# Patient Record
Sex: Female | Born: 1945
Health system: Southern US, Community
[De-identification: ages and names within clinical notes are randomized; demographics above are authoritative.]

## PROBLEM LIST (undated history)

## (undated) DIAGNOSIS — C439 Malignant melanoma of skin, unspecified: Secondary | ICD-10-CM

## (undated) DIAGNOSIS — I712 Thoracic aortic aneurysm, without rupture, unspecified: Secondary | ICD-10-CM

## (undated) DIAGNOSIS — Z923 Personal history of irradiation: Secondary | ICD-10-CM

## (undated) DIAGNOSIS — R000 Tachycardia, unspecified: Secondary | ICD-10-CM

## (undated) DIAGNOSIS — M1990 Unspecified osteoarthritis, unspecified site: Secondary | ICD-10-CM

## (undated) DIAGNOSIS — K219 Gastro-esophageal reflux disease without esophagitis: Secondary | ICD-10-CM

## (undated) DIAGNOSIS — C50919 Malignant neoplasm of unspecified site of unspecified female breast: Secondary | ICD-10-CM

## (undated) DIAGNOSIS — R079 Chest pain, unspecified: Secondary | ICD-10-CM

## (undated) DIAGNOSIS — J302 Other seasonal allergic rhinitis: Secondary | ICD-10-CM

## (undated) HISTORY — DX: Malignant melanoma of skin, unspecified: C43.9

## (undated) HISTORY — DX: Malignant neoplasm of unspecified site of unspecified female breast: C50.919

## (undated) HISTORY — DX: Gastro-esophageal reflux disease without esophagitis: K21.9

## (undated) HISTORY — DX: Other seasonal allergic rhinitis: J30.2

## (undated) HISTORY — DX: Thoracic aortic aneurysm, without rupture: I71.2

## (undated) HISTORY — PX: INGUINAL HERNIA REPAIR: SUR1180

## (undated) HISTORY — DX: Thoracic aortic aneurysm, without rupture, unspecified: I71.20

## (undated) HISTORY — DX: Unspecified osteoarthritis, unspecified site: M19.90

## (undated) HISTORY — DX: Tachycardia, unspecified: R00.0

## (undated) HISTORY — PX: TUBAL LIGATION: SHX77

## (undated) HISTORY — DX: Chest pain, unspecified: R07.9

## (undated) HISTORY — PX: OTHER SURGICAL HISTORY: SHX169

---

## 1983-11-19 HISTORY — PX: INGUINAL HERNIA REPAIR: SUR1180

## 1983-11-19 HISTORY — PX: TUBAL LIGATION: SHX77

## 2000-02-06 ENCOUNTER — Other Ambulatory Visit: Admission: RE | Admit: 2000-02-06 | Discharge: 2000-02-06 | Payer: Self-pay | Admitting: Obstetrics & Gynecology

## 2001-02-11 ENCOUNTER — Other Ambulatory Visit: Admission: RE | Admit: 2001-02-11 | Discharge: 2001-02-11 | Payer: Self-pay | Admitting: Obstetrics & Gynecology

## 2001-03-18 ENCOUNTER — Ambulatory Visit (HOSPITAL_COMMUNITY): Admission: RE | Admit: 2001-03-18 | Discharge: 2001-03-18 | Payer: Self-pay | Admitting: Gastroenterology

## 2001-03-18 ENCOUNTER — Encounter (INDEPENDENT_AMBULATORY_CARE_PROVIDER_SITE_OTHER): Payer: Self-pay | Admitting: Specialist

## 2002-02-25 ENCOUNTER — Other Ambulatory Visit: Admission: RE | Admit: 2002-02-25 | Discharge: 2002-02-25 | Payer: Self-pay | Admitting: Obstetrics & Gynecology

## 2003-03-02 ENCOUNTER — Other Ambulatory Visit: Admission: RE | Admit: 2003-03-02 | Discharge: 2003-03-02 | Payer: Self-pay | Admitting: Obstetrics & Gynecology

## 2004-02-22 ENCOUNTER — Ambulatory Visit (HOSPITAL_COMMUNITY): Admission: RE | Admit: 2004-02-22 | Discharge: 2004-02-22 | Payer: Self-pay | Admitting: Family Medicine

## 2004-03-02 ENCOUNTER — Other Ambulatory Visit: Admission: RE | Admit: 2004-03-02 | Discharge: 2004-03-02 | Payer: Self-pay | Admitting: Obstetrics & Gynecology

## 2004-09-07 ENCOUNTER — Encounter: Payer: Self-pay | Admitting: Internal Medicine

## 2005-04-02 ENCOUNTER — Other Ambulatory Visit: Admission: RE | Admit: 2005-04-02 | Discharge: 2005-04-02 | Payer: Self-pay | Admitting: Obstetrics & Gynecology

## 2005-11-18 DIAGNOSIS — Z923 Personal history of irradiation: Secondary | ICD-10-CM | POA: Insufficient documentation

## 2005-11-18 HISTORY — PX: BREAST BIOPSY: SHX20

## 2005-11-18 HISTORY — PX: BREAST LUMPECTOMY: SHX2

## 2005-11-18 HISTORY — DX: Personal history of irradiation: Z92.3

## 2006-05-06 ENCOUNTER — Encounter (INDEPENDENT_AMBULATORY_CARE_PROVIDER_SITE_OTHER): Payer: Self-pay | Admitting: *Deleted

## 2006-05-06 ENCOUNTER — Encounter (INDEPENDENT_AMBULATORY_CARE_PROVIDER_SITE_OTHER): Payer: Self-pay | Admitting: Radiology

## 2006-05-06 ENCOUNTER — Encounter: Admission: RE | Admit: 2006-05-06 | Discharge: 2006-05-06 | Payer: Self-pay | Admitting: Obstetrics & Gynecology

## 2006-05-16 ENCOUNTER — Encounter: Admission: RE | Admit: 2006-05-16 | Discharge: 2006-05-16 | Payer: Self-pay | Admitting: Obstetrics & Gynecology

## 2006-05-16 ENCOUNTER — Encounter: Admission: RE | Admit: 2006-05-16 | Discharge: 2006-05-16 | Payer: Self-pay | Admitting: General Surgery

## 2006-05-19 ENCOUNTER — Ambulatory Visit (HOSPITAL_BASED_OUTPATIENT_CLINIC_OR_DEPARTMENT_OTHER): Admission: RE | Admit: 2006-05-19 | Discharge: 2006-05-19 | Payer: Self-pay | Admitting: General Surgery

## 2006-05-19 ENCOUNTER — Encounter (INDEPENDENT_AMBULATORY_CARE_PROVIDER_SITE_OTHER): Payer: Self-pay | Admitting: Specialist

## 2006-05-19 ENCOUNTER — Encounter: Admission: RE | Admit: 2006-05-19 | Discharge: 2006-05-19 | Payer: Self-pay | Admitting: General Surgery

## 2006-05-26 ENCOUNTER — Ambulatory Visit: Payer: Self-pay | Admitting: Oncology

## 2006-06-11 LAB — CBC WITH DIFFERENTIAL/PLATELET
Basophils Absolute: 0 10*3/uL (ref 0.0–0.1)
EOS%: 3.4 % (ref 0.0–7.0)
Eosinophils Absolute: 0.2 10*3/uL (ref 0.0–0.5)
HGB: 12.9 g/dL (ref 11.6–15.9)
LYMPH%: 33.5 % (ref 14.0–48.0)
MCH: 30 pg (ref 26.0–34.0)
MCV: 89.7 fL (ref 81.0–101.0)
MONO%: 4.2 % (ref 0.0–13.0)
NEUT#: 3.2 10*3/uL (ref 1.5–6.5)
Platelets: 322 10*3/uL (ref 145–400)
RBC: 4.3 10*6/uL (ref 3.70–5.32)
RDW: 14.2 % (ref 11.3–14.5)

## 2006-06-11 LAB — COMPREHENSIVE METABOLIC PANEL
AST: 17 U/L (ref 0–37)
Alkaline Phosphatase: 60 U/L (ref 39–117)
BUN: 14 mg/dL (ref 6–23)
Glucose, Bld: 121 mg/dL — ABNORMAL HIGH (ref 70–99)
Potassium: 4.2 mEq/L (ref 3.5–5.3)
Total Bilirubin: 0.4 mg/dL (ref 0.3–1.2)

## 2006-06-11 LAB — LACTATE DEHYDROGENASE: LDH: 143 U/L (ref 94–250)

## 2006-06-16 ENCOUNTER — Ambulatory Visit: Admission: RE | Admit: 2006-06-16 | Discharge: 2006-09-09 | Payer: Self-pay | Admitting: Radiation Oncology

## 2006-08-08 ENCOUNTER — Ambulatory Visit: Payer: Self-pay | Admitting: Oncology

## 2006-10-03 ENCOUNTER — Ambulatory Visit: Payer: Self-pay | Admitting: Oncology

## 2006-10-07 LAB — URINALYSIS, MICROSCOPIC - CHCC
Nitrite: NEGATIVE
Protein: NEGATIVE mg/dL
pH: 7.5 (ref 4.6–8.0)

## 2007-01-29 ENCOUNTER — Ambulatory Visit: Payer: Self-pay | Admitting: Oncology

## 2007-02-03 LAB — CBC WITH DIFFERENTIAL/PLATELET
Basophils Absolute: 0 10*3/uL (ref 0.0–0.1)
EOS%: 4.1 % (ref 0.0–7.0)
HCT: 38.1 % (ref 34.8–46.6)
HGB: 13.1 g/dL (ref 11.6–15.9)
MCH: 30 pg (ref 26.0–34.0)
MCV: 87.8 fL (ref 81.0–101.0)
MONO%: 5.6 % (ref 0.0–13.0)
NEUT%: 62.2 % (ref 39.6–76.8)

## 2007-02-03 LAB — COMPREHENSIVE METABOLIC PANEL
AST: 14 U/L (ref 0–37)
Alkaline Phosphatase: 67 U/L (ref 39–117)
BUN: 19 mg/dL (ref 6–23)
Calcium: 9.3 mg/dL (ref 8.4–10.5)
Chloride: 104 mEq/L (ref 96–112)
Creatinine, Ser: 0.78 mg/dL (ref 0.40–1.20)

## 2007-03-17 ENCOUNTER — Ambulatory Visit: Payer: Self-pay | Admitting: Oncology

## 2007-04-20 ENCOUNTER — Encounter: Admission: RE | Admit: 2007-04-20 | Discharge: 2007-04-20 | Payer: Self-pay | Admitting: Obstetrics & Gynecology

## 2007-05-05 ENCOUNTER — Ambulatory Visit: Payer: Self-pay | Admitting: Oncology

## 2007-05-05 ENCOUNTER — Encounter: Payer: Self-pay | Admitting: Internal Medicine

## 2007-05-05 LAB — CBC WITH DIFFERENTIAL/PLATELET
BASO%: 0.5 % (ref 0.0–2.0)
EOS%: 2.1 % (ref 0.0–7.0)
HCT: 36.6 % (ref 34.8–46.6)
HGB: 12.8 g/dL (ref 11.6–15.9)
LYMPH%: 27 % (ref 14.0–48.0)
MCH: 30.9 pg (ref 26.0–34.0)
MCHC: 35 g/dL (ref 32.0–36.0)
NEUT#: 3.1 10*3/uL (ref 1.5–6.5)
NEUT%: 64.7 % (ref 39.6–76.8)
Platelets: 285 10*3/uL (ref 145–400)
RBC: 4.15 10*6/uL (ref 3.70–5.32)
WBC: 4.9 10*3/uL (ref 3.9–10.0)
lymph#: 1.3 10*3/uL (ref 0.9–3.3)

## 2007-05-05 LAB — CANCER ANTIGEN 27.29: CA 27.29: 13 U/mL (ref 0–39)

## 2007-05-05 LAB — COMPREHENSIVE METABOLIC PANEL
ALT: 21 U/L (ref 0–35)
AST: 18 U/L (ref 0–37)
Alkaline Phosphatase: 60 U/L (ref 39–117)
Creatinine, Ser: 0.77 mg/dL (ref 0.40–1.20)
Sodium: 142 mEq/L (ref 135–145)
Total Bilirubin: 0.3 mg/dL (ref 0.3–1.2)

## 2007-05-07 ENCOUNTER — Ambulatory Visit (HOSPITAL_COMMUNITY): Admission: RE | Admit: 2007-05-07 | Discharge: 2007-05-07 | Payer: Self-pay | Admitting: Oncology

## 2007-05-11 LAB — ESTRADIOL, ULTRA SENS: Estradiol, Ultra Sensitive: <2 pg/mL

## 2007-11-10 ENCOUNTER — Encounter: Payer: Self-pay | Admitting: Internal Medicine

## 2008-03-25 ENCOUNTER — Ambulatory Visit: Payer: Self-pay | Admitting: Oncology

## 2008-03-29 LAB — COMPREHENSIVE METABOLIC PANEL
ALT: 18 U/L (ref 0–35)
Albumin: 4.3 g/dL (ref 3.5–5.2)
Alkaline Phosphatase: 59 U/L (ref 39–117)
CO2: 24 mEq/L (ref 19–32)
Potassium: 4.4 mEq/L (ref 3.5–5.3)
Sodium: 142 mEq/L (ref 135–145)
Total Bilirubin: 0.3 mg/dL (ref 0.3–1.2)
Total Protein: 6.9 g/dL (ref 6.0–8.3)

## 2008-03-29 LAB — CBC WITH DIFFERENTIAL/PLATELET
BASO%: 0 % (ref 0.0–2.0)
LYMPH%: 22.8 % (ref 14.0–48.0)
MCHC: 34.1 g/dL (ref 32.0–36.0)
MONO#: 0.2 10*3/uL (ref 0.1–0.9)
NEUT#: 3.6 10*3/uL (ref 1.5–6.5)
Platelets: 298 10*3/uL (ref 145–400)
RBC: 4.12 10*6/uL (ref 3.70–5.32)
RDW: 13.2 % (ref 11.3–14.5)
WBC: 5.1 10*3/uL (ref 3.9–10.0)

## 2008-03-29 LAB — CANCER ANTIGEN 27.29: CA 27.29: 12 U/mL (ref 0–39)

## 2008-04-04 LAB — ESTRADIOL, ULTRA SENS: Estradiol, Ultra Sensitive: <2 pg/mL

## 2008-04-21 ENCOUNTER — Encounter: Admission: RE | Admit: 2008-04-21 | Discharge: 2008-04-21 | Payer: Self-pay | Admitting: Oncology

## 2008-09-19 ENCOUNTER — Encounter: Payer: Self-pay | Admitting: Internal Medicine

## 2008-10-19 ENCOUNTER — Encounter: Payer: Self-pay | Admitting: Internal Medicine

## 2008-11-01 ENCOUNTER — Ambulatory Visit (HOSPITAL_COMMUNITY): Admission: RE | Admit: 2008-11-01 | Discharge: 2008-11-01 | Payer: Self-pay | Admitting: Family Medicine

## 2009-03-28 ENCOUNTER — Ambulatory Visit: Payer: Self-pay | Admitting: Oncology

## 2009-03-30 ENCOUNTER — Encounter: Payer: Self-pay | Admitting: Internal Medicine

## 2009-03-30 LAB — CBC WITH DIFFERENTIAL/PLATELET
BASO%: 0.5 % (ref 0.0–2.0)
EOS%: 3.5 % (ref 0.0–7.0)
HCT: 39.6 % (ref 34.8–46.6)
MCH: 30.1 pg (ref 25.1–34.0)
MCHC: 33.8 g/dL (ref 31.5–36.0)
MONO#: 0.2 10*3/uL (ref 0.1–0.9)
NEUT%: 53.4 % (ref 38.4–76.8)
RDW: 13.9 % (ref 11.2–14.5)
WBC: 4.4 10*3/uL (ref 3.9–10.3)
lymph#: 1.6 10*3/uL (ref 0.9–3.3)

## 2009-03-31 LAB — CANCER ANTIGEN 27.29: CA 27.29: 15 U/mL (ref 0–39)

## 2009-03-31 LAB — VITAMIN D 25 HYDROXY (VIT D DEFICIENCY, FRACTURES): Vit D, 25-Hydroxy: 38 ng/mL (ref 30–89)

## 2009-03-31 LAB — COMPREHENSIVE METABOLIC PANEL
ALT: 17 U/L (ref 0–35)
AST: 15 U/L (ref 0–37)
Albumin: 4.5 g/dL (ref 3.5–5.2)
CO2: 26 mEq/L (ref 19–32)
Calcium: 9.4 mg/dL (ref 8.4–10.5)
Chloride: 104 mEq/L (ref 96–112)
Potassium: 4.5 mEq/L (ref 3.5–5.3)
Sodium: 139 mEq/L (ref 135–145)
Total Protein: 7.2 g/dL (ref 6.0–8.3)

## 2009-04-06 ENCOUNTER — Encounter: Payer: Self-pay | Admitting: Internal Medicine

## 2009-04-10 ENCOUNTER — Ambulatory Visit (HOSPITAL_COMMUNITY): Admission: RE | Admit: 2009-04-10 | Discharge: 2009-04-10 | Payer: Self-pay | Admitting: Oncology

## 2009-04-24 ENCOUNTER — Encounter: Admission: RE | Admit: 2009-04-24 | Discharge: 2009-04-24 | Payer: Self-pay | Admitting: Obstetrics & Gynecology

## 2009-06-01 ENCOUNTER — Observation Stay (HOSPITAL_COMMUNITY): Admission: EM | Admit: 2009-06-01 | Discharge: 2009-06-02 | Payer: Self-pay | Admitting: Emergency Medicine

## 2009-06-05 ENCOUNTER — Encounter: Payer: Self-pay | Admitting: Internal Medicine

## 2009-06-21 ENCOUNTER — Encounter: Payer: Self-pay | Admitting: Internal Medicine

## 2009-07-04 ENCOUNTER — Encounter: Payer: Self-pay | Admitting: Internal Medicine

## 2009-09-20 ENCOUNTER — Encounter: Payer: Self-pay | Admitting: Internal Medicine

## 2009-09-28 ENCOUNTER — Ambulatory Visit: Payer: Self-pay | Admitting: Oncology

## 2009-10-19 ENCOUNTER — Encounter: Payer: Self-pay | Admitting: Internal Medicine

## 2009-11-07 DIAGNOSIS — R0989 Other specified symptoms and signs involving the circulatory and respiratory systems: Secondary | ICD-10-CM | POA: Insufficient documentation

## 2009-11-07 DIAGNOSIS — R079 Chest pain, unspecified: Secondary | ICD-10-CM

## 2009-11-09 ENCOUNTER — Ambulatory Visit: Payer: Self-pay | Admitting: Internal Medicine

## 2009-11-09 DIAGNOSIS — R9431 Abnormal electrocardiogram [ECG] [EKG]: Secondary | ICD-10-CM

## 2009-11-09 DIAGNOSIS — R002 Palpitations: Secondary | ICD-10-CM | POA: Insufficient documentation

## 2009-11-09 HISTORY — DX: Palpitations: R00.2

## 2009-11-09 HISTORY — DX: Abnormal electrocardiogram (ECG) (EKG): R94.31

## 2009-11-16 ENCOUNTER — Ambulatory Visit: Payer: Self-pay | Admitting: Oncology

## 2009-11-16 ENCOUNTER — Ambulatory Visit: Payer: Self-pay | Admitting: Cardiovascular Disease

## 2009-11-16 ENCOUNTER — Encounter: Payer: Self-pay | Admitting: Cardiology

## 2009-11-16 ENCOUNTER — Ambulatory Visit: Payer: Self-pay

## 2009-11-16 ENCOUNTER — Ambulatory Visit: Payer: Self-pay | Admitting: Internal Medicine

## 2009-11-16 ENCOUNTER — Ambulatory Visit (HOSPITAL_COMMUNITY): Admission: RE | Admit: 2009-11-16 | Discharge: 2009-11-16 | Payer: Self-pay | Admitting: Internal Medicine

## 2009-12-20 ENCOUNTER — Telehealth: Payer: Self-pay | Admitting: Internal Medicine

## 2010-01-08 ENCOUNTER — Ambulatory Visit: Payer: Self-pay | Admitting: Internal Medicine

## 2010-01-08 DIAGNOSIS — I712 Thoracic aortic aneurysm, without rupture, unspecified: Secondary | ICD-10-CM

## 2010-01-08 HISTORY — DX: Thoracic aortic aneurysm, without rupture, unspecified: I71.20

## 2010-01-08 HISTORY — DX: Thoracic aortic aneurysm, without rupture: I71.2

## 2010-01-12 ENCOUNTER — Encounter: Payer: Self-pay | Admitting: Internal Medicine

## 2010-01-12 LAB — CONVERTED CEMR LAB
ALT: 36 units/L — ABNORMAL HIGH (ref 0–35)
Albumin: 4.2 g/dL (ref 3.5–5.2)
BUN: 15 mg/dL (ref 6–23)
Calcium: 9.7 mg/dL (ref 8.4–10.5)
Free T4: 0.9 ng/dL (ref 0.6–1.6)
Potassium: 4.3 meq/L (ref 3.5–5.1)
Sodium: 144 meq/L (ref 135–145)
T3, Free: 2.8 pg/mL (ref 2.3–4.2)
TSH: 2.75 microintl units/mL (ref 0.35–5.50)
Total Protein: 7.4 g/dL (ref 6.0–8.3)

## 2010-02-13 ENCOUNTER — Ambulatory Visit: Payer: Self-pay | Admitting: Internal Medicine

## 2010-02-22 ENCOUNTER — Telehealth: Payer: Self-pay | Admitting: Internal Medicine

## 2010-03-28 ENCOUNTER — Ambulatory Visit: Payer: Self-pay | Admitting: Oncology

## 2010-03-29 LAB — COMPREHENSIVE METABOLIC PANEL
ALT: 20 U/L (ref 0–35)
AST: 17 U/L (ref 0–37)
Albumin: 4.5 g/dL (ref 3.5–5.2)
BUN: 20 mg/dL (ref 6–23)
CO2: 22 mEq/L (ref 19–32)
Calcium: 9.7 mg/dL (ref 8.4–10.5)
Chloride: 103 mEq/L (ref 96–112)
Creatinine, Ser: 0.79 mg/dL (ref 0.40–1.20)
Potassium: 4.2 mEq/L (ref 3.5–5.3)

## 2010-03-29 LAB — CBC WITH DIFFERENTIAL/PLATELET
BASO%: 0.4 % (ref 0.0–2.0)
Basophils Absolute: 0 10*3/uL (ref 0.0–0.1)
EOS%: 2.1 % (ref 0.0–7.0)
HCT: 37 % (ref 34.8–46.6)
HGB: 12.9 g/dL (ref 11.6–15.9)
MCH: 30.9 pg (ref 25.1–34.0)
MONO#: 0.2 10*3/uL (ref 0.1–0.9)
NEUT%: 62.3 % (ref 38.4–76.8)
RDW: 13.7 % (ref 11.2–14.5)
WBC: 5.9 10*3/uL (ref 3.9–10.3)
lymph#: 1.9 10*3/uL (ref 0.9–3.3)

## 2010-03-29 LAB — CANCER ANTIGEN 27.29: CA 27.29: 10 U/mL (ref 0–39)

## 2010-04-10 ENCOUNTER — Ambulatory Visit: Payer: Self-pay | Admitting: Internal Medicine

## 2010-04-25 ENCOUNTER — Encounter: Admission: RE | Admit: 2010-04-25 | Discharge: 2010-04-25 | Payer: Self-pay | Admitting: Obstetrics & Gynecology

## 2010-06-26 ENCOUNTER — Ambulatory Visit: Payer: Self-pay | Admitting: Oncology

## 2010-06-28 LAB — COMPREHENSIVE METABOLIC PANEL
ALT: 25 U/L (ref 0–35)
AST: 19 U/L (ref 0–37)
Albumin: 4.5 g/dL (ref 3.5–5.2)
Alkaline Phosphatase: 64 U/L (ref 39–117)
BUN: 18 mg/dL (ref 6–23)
Calcium: 9.2 mg/dL (ref 8.4–10.5)
Chloride: 102 mEq/L (ref 96–112)
Potassium: 4.1 mEq/L (ref 3.5–5.3)
Sodium: 138 mEq/L (ref 135–145)
Total Protein: 6.7 g/dL (ref 6.0–8.3)

## 2010-06-28 LAB — CBC WITH DIFFERENTIAL/PLATELET
BASO%: 0.4 % (ref 0.0–2.0)
Basophils Absolute: 0 10*3/uL (ref 0.0–0.1)
EOS%: 2 % (ref 0.0–7.0)
HGB: 12.4 g/dL (ref 11.6–15.9)
MCH: 30.8 pg (ref 25.1–34.0)
RBC: 4.05 10*6/uL (ref 3.70–5.45)
RDW: 13.8 % (ref 11.2–14.5)
lymph#: 1.4 10*3/uL (ref 0.9–3.3)

## 2010-06-28 LAB — CANCER ANTIGEN 27.29: CA 27.29: 15 U/mL (ref 0–39)

## 2010-06-28 LAB — VITAMIN D 25 HYDROXY (VIT D DEFICIENCY, FRACTURES): Vit D, 25-Hydroxy: 50 ng/mL (ref 30–89)

## 2010-07-18 IMAGING — MG MM DIAGNOSTIC BILATERAL
5 series · 5 of 5 positions shown · non-contrast
Comparison: With priors

CLINICAL DATA: History of right breast cancer status post
lumpectomy

DIGITAL DIAGNOSTIC BILATERAL MAMMOGRAM WITH CAD

[R CC]
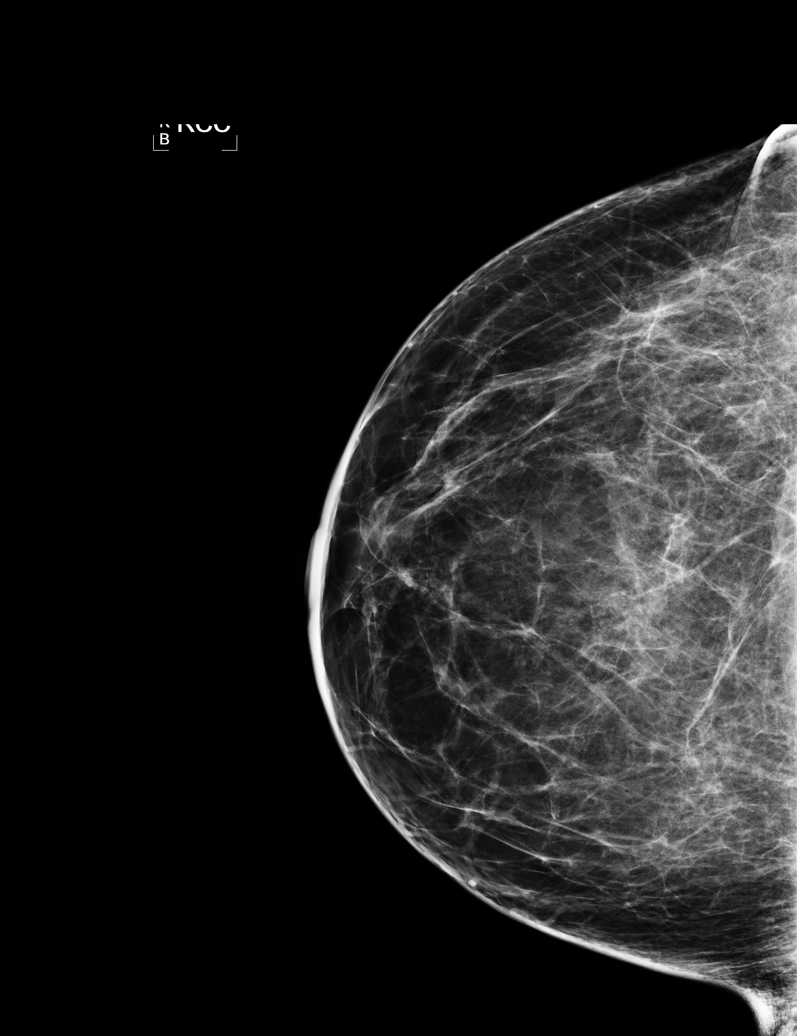

[L CC]
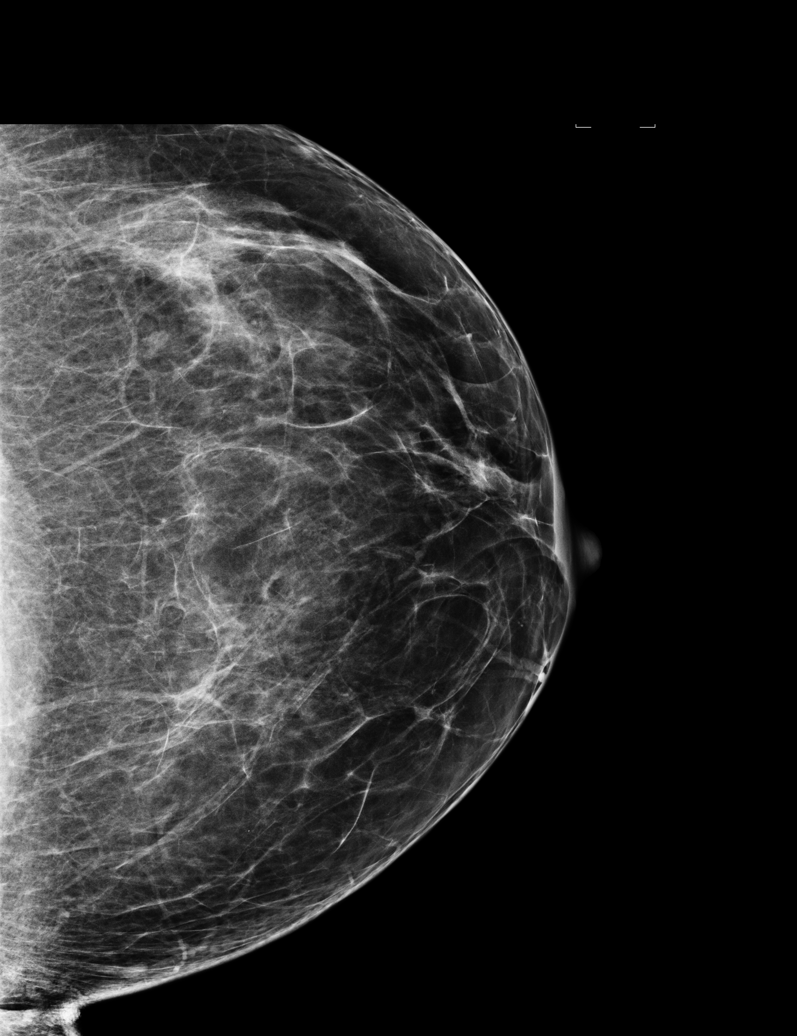

[L MLO]
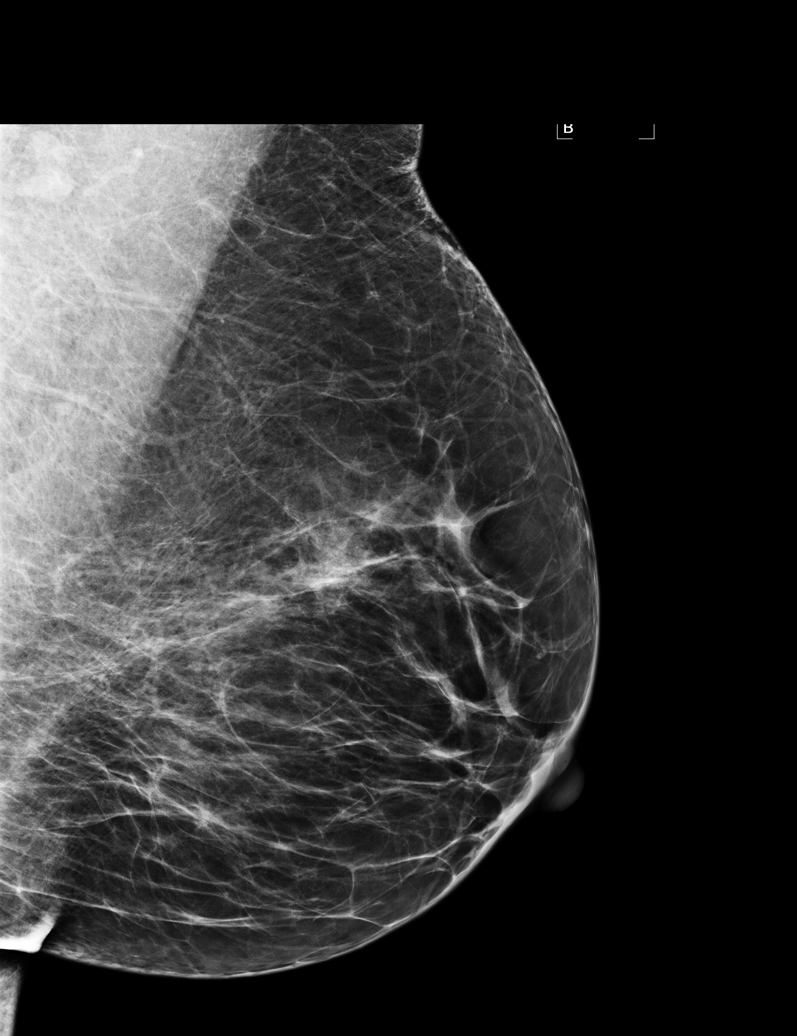

[R MLO]
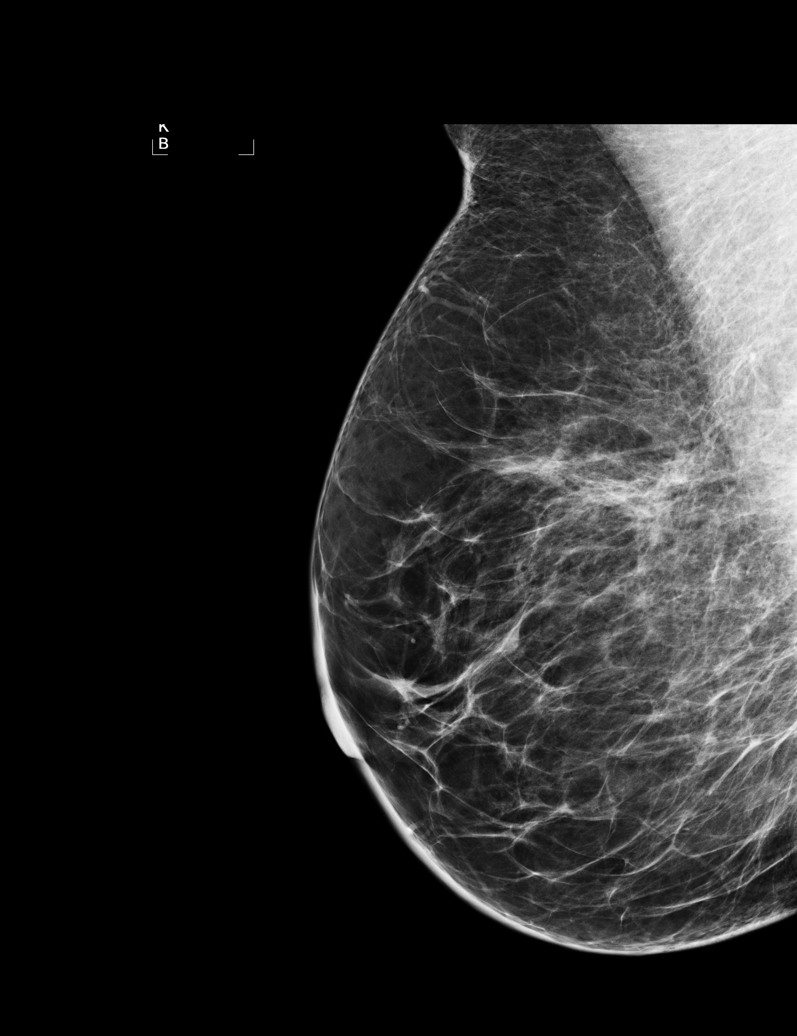

[R TAN]
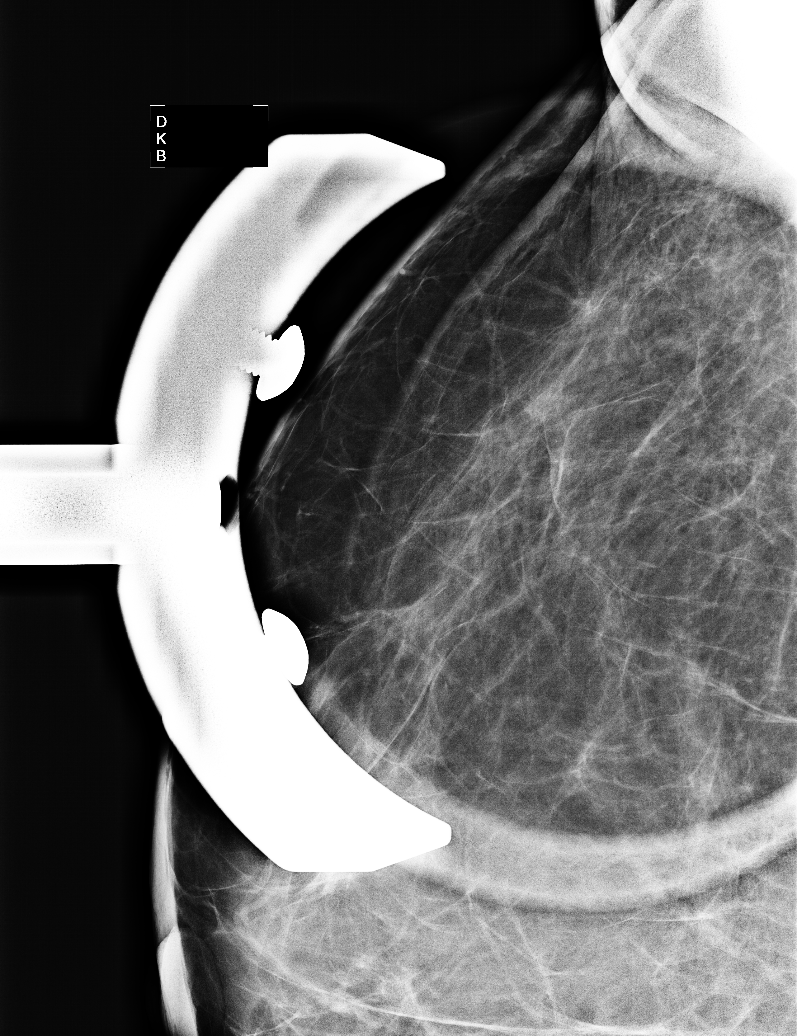

[5 of 5 positions shown; findings below may reference images not displayed]

FINDINGS: There are scattered fibroglandular densities.  Post
lumpectomy changes are seen in the right breast.  No new suspicious
mass or malignant-type microcalcifications seen in either breast.
IMPRESSION: No evidence of malignancy in either breast.  Diagnostic mammogram
in 1 year is recommended.

BI-RADS CATEGORY 2:  Benign finding(s).

## 2010-12-20 NOTE — Consult Note (Signed)
Summary: Catherine Holland   Imported By: Marylou Mccoy 12/05/2009 09:15:43  _____________________________________________________________________  External Attachment:    Type:   Image     Comment:   External Document

## 2010-12-20 NOTE — Assessment & Plan Note (Signed)
Summary: F3M   Visit Type:  Follow-up Referring Provider:  Toni Arthurs Primary Provider:  Selena Batten  CC:  tachycardiac and after bending over was fatigued immediately.  History of Present Illness: Catherine Holland is a 65 y/o woman with h/o breast CA, GERD, mild descending thoracic aorta aneurysm referred by Toni Arthurs for further evaluation of tachycardia with HR up to 190 on treadmill at gym. We saw her in December 2010 for initial visit. She returns today for f/u.   Denies any h/o known heart disease. Had episode of chest pain in January of this year and was admitted. Had f/u pharmacologic Myoview which was normal in July or August 2010.  Also had ECHO with EF 55-60% no valvular abnormalities. Monitor SR with rare PACs but no events reported while wearing monitor.  Last month had treadmill and did well with no evidence of abnormal tachycardia or atrial dysrhythmias. Felt that HRs from gym were due to artifactual reading from equipment.   Here for f/u. Doing well back at the gym .No problems. Feeling well. 2 weeks ago had episode of tachypalpitations while at her desk. lasted about a minute. No associated symptoms. No syncope.       Current Medications (verified): 1)  Arimidex 1 Mg Tabs (Anastrozole) .... Once Daily (On Hold) 2)  Actonel 35 Mg Tabs (Risedronate Sodium) .... Weekly 3)  Vitamin D 1000 Unit  Tabs (Cholecalciferol) .... 2 Tabs Once Daily 4)  Calcium Carbonate-Vitamin D 600-400 Mg-Unit  Tabs (Calcium Carbonate-Vitamin D) .... Two Times A Day 5)  Aciphex 20 Mg Tbec (Rabeprazole Sodium) .... Once Daily 6)  Multivitamins   Tabs (Multiple Vitamin) .... Once Daily 7)  Aspirin 81 Mg Tbec (Aspirin) .... Take One Tablet By Mouth Daily  Allergies (verified): 1)  ! Demerol 2)  ! Clemens [employer]  Past History:  Past Medical History: TACHYCARDIA      --monitor 12/10. Sr with PACs     --echo 12/10. EF normal. no signifcant valvular abnormalities.      --ETT normal 4/11 CHEST  PAIN-UNSPECIFIED (ICD-786.50)    --normal myoview in july 2010 Breast cancer 2007    --s/p lumpectomy and XRT    --Arimdex GERD h/o Melanoma Small thoracia aortic aneurysm  Review of Systems       As per HPI and past medical history; otherwise all systems negative.   Vital Signs:  Patient profile:   65 year old female Height:      67 inches Weight:      163 pounds BMI:     25.62 Pulse rate:   71 / minute BP sitting:   124 / 76  (left arm) Cuff size:   regular  Vitals Entered By: Hardin Negus, RMA (Apr 10, 2010 9:33 AM)  Physical Exam  General:  Gen: well appearing. no resp difficulty HEENT: normal Neck: supple. no JVD. Carotids 2+ bilat; no bruits. No lymphadenopathy or thryomegaly appreciated. Cor: PMI nondisplaced. Regular rate & rhythm. No rubs, gallops. soft SEM at RSB. s2 ok Lungs: clear Abdomen: soft, nontender, nondistended. No hepatosplenomegaly. No bruits or masses. Good bowel sounds. Extremities: no cyanosis, clubbing, rash, edema Neuro: alert & orientedx3, cranial nerves grossly intact. moves all 4 extremities w/o difficulty. affect pleasant    Impression & Recommendations:  Problem # 1:  PALPITATIONS (ICD-785.1) Possbile SVT but we haven't been able to capture. She is doing very well otherwise. Resassured her that these were benign. Discussed the possibility of an implantable loop recorderif symptoms becam intolerable but she  says the symptoms are very mild. Will see back in 1 year. Told her to call if symptoms worsen.   Other Orders: EKG w/ Interpretation (93000)  Patient Instructions: 1)  Follow up in 1 year

## 2010-12-20 NOTE — Letter (Signed)
Summary: [facility]  [facility]   Imported By: Marylou Mccoy 12/05/2009 09:26:45  _____________________________________________________________________  External Attachment:    Type:   Image     Comment:   External Document

## 2010-12-20 NOTE — Progress Notes (Signed)
Summary: QUESTION ABOUT APPT ON 04/10/10  Phone Note Call from Patient Call back at [facility] Phone [phone]   Caller: Patient Summary of Call: PT WANT TO KNOW IF SHE STILL NEEDS TO KEEP APPT ON 04/10/10 Initial call taken by: Judie Grieve,  February 22, 2010 4:47 PM  Follow-up for Phone Call        spoke w/pts husband wanted to make sure needed f/u appt Meredith Staggers, RN  February 22, 2010 5:22 PM

## 2010-12-20 NOTE — Progress Notes (Signed)
Summary: monitor results  Phone Note Outgoing Call   Call placed by: Meredith Staggers, RN,  December 20, 2009 6:11 PM Summary of Call: Dr Gala Romney reviewed monitor results, SR w/rare PAC's, pt is aware

## 2010-12-20 NOTE — Assessment & Plan Note (Signed)
Summary: per check out/sf   Referring Provider:  Toni Arthurs Primary Provider:  Selena Batten  CC:  February 3rd HR was 183 on treadmill.  History of Present Illness: Catherine Holland is a 65 y/o woman with h/o breast CA, GERD, mild descending thoracic aorta aneurysm referred by Toni Arthurs for further evaluation of tachycardia with HR up to 190 on treadmill at gym. We saw her in December 2010 for initial visit. She returns today for f/u.   Denies any h/o known heart disease. Had episode of chest pain in January of this year and was admitted. Had f/u pharmacologic Myoview which was normal in July or August 2010.  SInce we last saw her had ECHO with EF 55-60% no valvular abnormalities. Monitor SR with rare PACs.  Overall doing well. While wearing monitor didn't have any events. However on Feb3rd had episode when HR monitor jumped up from 126 to 183 on treadmill. Doesn't feel it. Lasted for about 15-20 seconds and then came back down to 120s. No syncope/presyncope. Occasional brief palpiatations while laying down.       Current Medications (verified): 1)  Arimidex 1 Mg Tabs (Anastrozole) .... Once Daily (On Hold) 2)  Actonel 35 Mg Tabs (Risedronate Sodium) .... Weekly 3)  Vitamin D 1000 Unit  Tabs (Cholecalciferol) .... 2 Tabs Once Daily 4)  Calcium Carbonate-Vitamin D 600-400 Mg-Unit  Tabs (Calcium Carbonate-Vitamin D) .... Two Times A Day 5)  Aciphex 20 Mg Tbec (Rabeprazole Sodium) .... Once Daily 6)  Multivitamins   Tabs (Multiple Vitamin) .... Once Daily 7)  Aspirin 81 Mg Tbec (Aspirin) .... Take One Tablet By Mouth Daily 8)  Femara 2.5 Mg Tabs (Letrozole) .... Once Daily  Allergies (verified): 1)  ! Demerol 2)  ! [employer]  Past History:  Past Medical History: TACHYCARDIA      --monitor 12/10. Sr with PACs     --echo 12/10. EF normal. no signifcant valvular abnormalities. CHEST PAIN-UNSPECIFIED (ICD-786.50)    --normal myoview in july 2010 Breast cancer 2007    --s/p lumpectomy  and XRT    --Arimdex GERD h/o Melanoma Small thoracia aortic aneurysm  Review of Systems       As per HPI and past medical history; otherwise all systems negative.   Vital Signs:  Patient profile:   65 year old female Height:      67 inches Weight:      172 pounds BMI:     27.04 Pulse rate:   70 / minute BP sitting:   118 / 74  (left arm) Cuff size:   regular  Vitals Entered By: Hardin Negus, RMA (January 08, 2010 9:07 AM)  Physical Exam  General:  Gen: well appearing. no resp difficulty HEENT: normal Neck: supple. no JVD. Carotids 2+ bilat; no bruits. No lymphadenopathy or thryomegaly appreciated. Cor: PMI nondisplaced. Regular rate & rhythm. No rubs, gallops. soft SEM at RSB. s2 ok Lungs: clear Abdomen: soft, nontender, nondistended. No hepatosplenomegaly. No bruits or masses. Good bowel sounds. Extremities: no cyanosis, clubbing, rash, edema Neuro: alert & orientedx3, cranial nerves grossly intact. moves all 4 extremities w/o difficulty. affect pleasant    Impression & Recommendations:  Problem # 1:  TACHYCARDIA (ICD-785)  Work-up so far has been unrevealing. Question if this is SVT or artifact by HR monitor. I reassured her that this is likely benign. Check CMET anfd TFTs.  Orders: TLB-BMP (Basic Metabolic Panel-BMET) (80048-METABOL) TLB-Hepatic/Liver Function Pnl (80076-HEPATIC) TLB-T4 (Thyrox), Free [phone]) TLB-TSH (Thyroid Stimulating Hormone) (84443-TSH) TLB-T3, Free (Triiodothyronine) (  84481-T3FREE)  Problem # 2:  ANEURYSM, THORACIC AORTIC (ICD-441.2) Followed by Dr. Corliss Blacker and Dr. Darnelle Catalan. We are happy to assume responsibility for surveillance if they would like.   Other Orders: Treadmill (Treadmill)  Patient Instructions: 1)  Your physician has requested that you have an exercise tolerance test.  For further information please visit [email or web address]  Please also follow instruction sheet, as given. 2)  Follow up in 3 months

## 2010-12-20 NOTE — Letter (Signed)
Summary: Generic Letter  Architectural technologist, Main Office  [address]   [place], [place] [address]   Phone: [phone]  Fax: [phone]        January 12, 2010 MRN: [id number]    Catherine Holland [address] [place], [place]  [address]    Dear [name],  Your labwork from 01/08/10 was ok.  If you have any questions please give [place] a call.     Sincerely,  Meredith Staggers, RN Arvilla Meres, MD  This letter has been electronically signed by your physician.

## 2010-12-27 ENCOUNTER — Other Ambulatory Visit: Payer: Self-pay | Admitting: Oncology

## 2010-12-27 ENCOUNTER — Encounter (HOSPITAL_BASED_OUTPATIENT_CLINIC_OR_DEPARTMENT_OTHER): Payer: BC Managed Care – PPO | Admitting: Oncology

## 2010-12-27 DIAGNOSIS — Z170 Estrogen receptor positive status [ER+]: Secondary | ICD-10-CM

## 2010-12-27 DIAGNOSIS — C50919 Malignant neoplasm of unspecified site of unspecified female breast: Secondary | ICD-10-CM

## 2010-12-27 LAB — COMPREHENSIVE METABOLIC PANEL
ALT: 19 U/L (ref 0–35)
Albumin: 4.6 g/dL (ref 3.5–5.2)
BUN: 21 mg/dL (ref 6–23)
CO2: 26 mEq/L (ref 19–32)
Calcium: 9.8 mg/dL (ref 8.4–10.5)
Chloride: 104 mEq/L (ref 96–112)
Creatinine, Ser: 0.59 mg/dL (ref 0.40–1.20)

## 2010-12-27 LAB — CBC WITH DIFFERENTIAL/PLATELET
BASO%: 0.4 % (ref 0.0–2.0)
Basophils Absolute: 0 10*3/uL (ref 0.0–0.1)
HCT: 37.9 % (ref 34.8–46.6)
HGB: 12.6 g/dL (ref 11.6–15.9)
MONO#: 0.2 10*3/uL (ref 0.1–0.9)
NEUT#: 3 10*3/uL (ref 1.5–6.5)
NEUT%: 61.9 % (ref 38.4–76.8)
WBC: 4.8 10*3/uL (ref 3.9–10.3)
lymph#: 1.5 10*3/uL (ref 0.9–3.3)

## 2010-12-27 LAB — CANCER ANTIGEN 27.29: CA 27.29: 11 U/mL (ref 0–39)

## 2011-01-03 ENCOUNTER — Encounter (HOSPITAL_BASED_OUTPATIENT_CLINIC_OR_DEPARTMENT_OTHER): Payer: BC Managed Care – PPO | Admitting: Oncology

## 2011-01-03 ENCOUNTER — Other Ambulatory Visit: Payer: Self-pay | Admitting: Oncology

## 2011-01-03 DIAGNOSIS — R21 Rash and other nonspecific skin eruption: Secondary | ICD-10-CM

## 2011-01-03 DIAGNOSIS — C50919 Malignant neoplasm of unspecified site of unspecified female breast: Secondary | ICD-10-CM

## 2011-01-03 DIAGNOSIS — Z170 Estrogen receptor positive status [ER+]: Secondary | ICD-10-CM

## 2011-01-03 DIAGNOSIS — Z9889 Other specified postprocedural states: Secondary | ICD-10-CM

## 2011-01-09 ENCOUNTER — Other Ambulatory Visit: Payer: Self-pay | Admitting: Dermatology

## 2011-02-24 LAB — POCT I-STAT, CHEM 8
Chloride: 106 mEq/L (ref 96–112)
Glucose, Bld: 96 mg/dL (ref 70–99)
HCT: 43 % (ref 36.0–46.0)
Potassium: 4.3 mEq/L (ref 3.5–5.1)
Sodium: 138 mEq/L (ref 135–145)

## 2011-02-24 LAB — CBC
Hemoglobin: 13.4 g/dL (ref 12.0–15.0)
Platelets: 258 10*3/uL (ref 150–400)
RDW: 13.5 % (ref 11.5–15.5)

## 2011-02-24 LAB — LIPID PANEL
Cholesterol: 205 mg/dL — ABNORMAL HIGH (ref 0–200)
HDL: 65 mg/dL (ref >39)
LDL Cholesterol: 117 mg/dL — ABNORMAL HIGH (ref 0–99)
Total CHOL/HDL Ratio: 3.2 RATIO
VLDL: 23 mg/dL (ref 0–40)

## 2011-02-24 LAB — POCT CARDIAC MARKERS
CKMB, poc: 1.3 ng/mL (ref 1.0–8.0)
Myoglobin, poc: 74.3 ng/mL (ref 12–200)
Troponin i, poc: <0.05 ng/mL (ref 0.00–0.09)

## 2011-03-20 ENCOUNTER — Encounter: Payer: Self-pay | Admitting: Internal Medicine

## 2011-03-25 ENCOUNTER — Ambulatory Visit (INDEPENDENT_AMBULATORY_CARE_PROVIDER_SITE_OTHER): Payer: BC Managed Care – PPO | Admitting: Internal Medicine

## 2011-03-25 ENCOUNTER — Encounter: Payer: Self-pay | Admitting: Internal Medicine

## 2011-03-25 VITALS — BP 144/88 | HR 58 | Ht 65.0 in | Wt 171.4 lb

## 2011-03-25 DIAGNOSIS — I10 Essential (primary) hypertension: Secondary | ICD-10-CM

## 2011-03-25 DIAGNOSIS — R002 Palpitations: Secondary | ICD-10-CM

## 2011-03-25 HISTORY — DX: Essential (primary) hypertension: I10

## 2011-03-25 NOTE — Assessment & Plan Note (Signed)
Resolved. Work-up normal. No further testing at this point.

## 2011-03-25 NOTE — Patient Instructions (Signed)
Your physician recommends that you schedule a follow-up appointment in: as needed  

## 2011-03-25 NOTE — Progress Notes (Signed)
PCP: Selena Batten  HPI:  Catherine Holland is a 65 y/o woman with h/o breast CA, GERD, mild descending thoracic aorta aneurysm referred by Toni Arthurs for further evaluation of tachycardia with HR up to 190 on treadmill at gym. We saw her in December 2010 for initial visit. She returns today for f/u.   Denies any h/o known heart disease. Had episode of chest pain in January of this year and was admitted. Had f/u pharmacologic Myoview which was normal in July or August 2010.  Also had ECHO with EF 55-60% no valvular abnormalities. Monitor SR with rare PACs but no events reported while wearing monitor.  In 3/11 had treadmill and did well with no evidence of abnormal tachycardia or atrial dysrhythmias. Felt that HRs from gym were due to artifactual reading from equipment.   Here for routine f/u. Doing well back at the gym doing water aerobics at least 3 days per week. No problems. Feeling well. No significant tachypalpitations. BP running slightly high at home.     ROS: All systems negative except as listed in HPI, PMH and Problem List.  Past Medical History  Diagnosis Date  . Tachycardia     -monitor 12/10. Sr with PACs --echo 12/10. EF normal. no signifcant valvular abnormalities. --ETT normal 4/11    . Chest pain, unspecified     --normal myoview in july 2010   . Breast cancer     --s/p lumpectomy and XRT --Arimdex  . GERD (gastroesophageal reflux disease)   . Melanoma   . Thoracic aortic aneurysm     Current Outpatient Prescriptions  Medication Sig Dispense Refill  . anastrozole (ARIMIDEX) 1 MG tablet Take 1 mg by mouth daily.       Marland Kitchen aspirin 81 MG EC tablet Take 81 mg by mouth daily.        . Calcium Carbonate-Vit D-Min 600-400 MG-UNIT TABS Take 1 tablet by mouth daily.       . cholecalciferol (VITAMIN D) 1000 UNITS tablet Take 2 tabs once daily.       . Multiple Vitamin (MULTIVITAMIN) tablet Take 1 tablet by mouth daily.        . RABEprazole (ACIPHEX) 20 MG tablet Take 20 mg by mouth as  needed.       . risedronate (ACTONEL) 35 MG tablet Take 35 mg by mouth every 7 (seven) days.           PHYSICAL EXAM: Filed Vitals:   03/25/11 1004  BP: 144/88  Pulse: 58   General:  Well appearing. No resp difficulty HEENT: normal Neck: supple. JVP flat. Carotids 2+ bilaterally; no bruits. No lymphadenopathy or thryomegaly appreciated. Cor: PMI normal. Regular rate & rhythm. No rubs, gallops or murmurs. Lungs: clear Abdomen: soft, nontender, nondistended. No hepatosplenomegaly. No bruits or masses. Good bowel sounds. Extremities: no cyanosis, clubbing, rash, tr edema R>L Neuro: alert & orientedx3, cranial nerves grossly intact. Moves all 4 extremities w/o difficulty. Affect pleasant.    ECG:  Sinus brady 58 No ST-T wave abnormalities.     ASSESSMENT & PLAN:

## 2011-03-25 NOTE — Assessment & Plan Note (Signed)
BP mildly elevated with trivial LE edema. Suggested conservative measures for now (diet, weight loss, low-salt). If BP remains up can consider low-dose diuretic in furture.

## 2011-04-02 NOTE — Discharge Summary (Signed)
NAME:  Catherine Holland, Catherine Holland              ACCOUNT NO.:  [id number]   MEDICAL RECORD NO.:  [id number]          PATIENT TYPE:  OBV   LOCATION:  2502                         FACILITY:  [facility]   PHYSICIAN:  Jake Bathe, MD      DATE OF BIRTH:  1946/04/12   DATE OF ADMISSION:  06/01/2009  DATE OF DISCHARGE:  06/02/2009                               DISCHARGE SUMMARY   FINAL DIAGNOSES:  1. Chest pain - atypical  2. History of breast cancer.  3. History of gastroesophageal reflux disease.  4. Aortic aneurysm.  Once again her CT of the abdominal aorta and      thoracic aorta were unremarkable.  The proximal descending thoracic      aorta measured 3.5 x 3.5 cm.  5. Aberrant right subclavian artery arising from the distal transverse      segment of the aorta.   BRIEF HOSPITAL COURSE:  A 65 year old female with a history of breast  cancer who was admitted yesterday after persistent daily long chest  discomfort localized to the epigastric/substernal chest region.  No  change with GI cocktail, nitroglycerin, aspirin.  Later in the evening,  she did eat some chicken, which helped resolve the discomfort.  This was  characterized as a dull type of chest pain, moderate in severity all day  yesterday.  In the past month, she had right upper quadrant and right  lower quadrant abdominal discomfort, which was investigated by her  oncologist with an ultrasound, which demonstrated no evidence of  gallbladder disease.   Yesterday, she did have a CT angiogram of her chest and abdomen, which  were unremarkable.  No evidence of pulmonary embolism.  She did have  subtle air-fluid level in the thoracic esophagus, which may contribute  to her symptoms.  She does have an aberrant right subclavian artery.  The radiologist noted that there is subtle air fluid level within the  thoracic esophagus just above the aberrant vessel, which traversed  posterior to the esophagus.  In this patient with a history of GERD,  this  vessel may be symptomatic causing decreased esophageal motility.   All lab work was unremarkable.  Cardiac biomarkers all negative.  Her  LDL cholesterol was 117.  Her chest CT was personally reviewed and  showed no evidence of any coronary calcification.   Exam on day of discharge was unremarkable.  No epigastric tenderness,  ambulating well.  Blood pressure has been in the upper 90s to 125  systolic, she is afebrile, heart rate in the 60s to 70s.  Lungs are  clear.  Heart is regular with no rub.  Skin shows no rashes.   PLAN:  She has close followup on Monday at noon with a scheduled nuclear  stress test in the office setting.  She has been instructed to be n.p.o.   DISCHARGE MEDICATIONS:  1. Aspirin 81 mg once a day.  2. Aciphex 20 mg once a day (she already had a prescription for this).  3. Arimidex as before 1 mg a day.  4. Vitamin D 1000 units a day as before.  5.  Actonel 35 mg every week as before.   She knows to contact us immediately if any symptoms become more  worrisome.      Jake Bathe, MD  Electronically Signed     MCS/MEDQ  D:  06/02/2009  T:  06/02/2009  Job:  [id number]   cc:   Armanda Magic, M.D.  Pam Drown, M.D.

## 2011-04-05 NOTE — Procedures (Signed)
[facility]  Patient:    Catherine Holland, Catherine Holland                     MRN: [id number] Proc. Date: 03/18/01 Adm. Date:  [id number] Attending:  Louie Bun CC:         Jamesetta Geralds, M.D.   Procedure Report  PROCEDURE:  Colonoscopy with polypectomy.  INDICATION FOR PROCEDURE:  Persistent left sided abdominal pain with fairly exhaustive workup unrevealing. At age 65, she has had no previous colon screening.  DESCRIPTION OF PROCEDURE:  The patient was placed in the left lateral decubitus position then placed on the pulse monitor with continuous low flow oxygen delivered by nasal cannula. She was sedated with 62.5 mg IV fentanyl and 7 mg IV Versed. The Olympus video colonoscope was inserted into the rectum and advanced to the cecum, confirmed by transillumination at McBurneys point and visualization of the ileocecal valve and appendiceal orifice. The prep was excellent.  The cecum, and ascending colon appeared normal with no masses, polyps, diverticula or other mucosal abnormalities. Within the proximal ascending colon, there was a small 8 mm sigmoid polyp which was fulgurated by hot biopsy. The remainder of the ascending, transverse, and descending colon appeared normal with no further masses, polyps, diverticula or other mucosal abnormalities. Within the sigmoid colon, there were seen a few scattered diverticula and the rectum appeared normal with no further abnormalities. The colonoscope was then withdrawn and the patient returned to the recovery room in stable condition. The patient tolerated the procedure well and there were no immediate complications.  IMPRESSION: 1. Ascending colon polyp. 2. Sigmoid diverticulosis.  PLAN:  Will await biopsy results and pursue further workup of left sided abdominal pain as indicated. DD:  03/18/01 TD:  03/19/01 Job: [id number]

## 2011-04-05 NOTE — Op Note (Signed)
NAME:  Catherine Holland, Catherine Holland              ACCOUNT NO.:  [id number]   MEDICAL RECORD NO.:  [id number]          PATIENT TYPE:  AMB   LOCATION:  DSC                          FACILITY:  [facility]   PHYSICIAN:  Rose Phi. Maple Hudson, M.D.   DATE OF BIRTH:  1946-05-29   DATE OF PROCEDURE:  05/19/2006  DATE OF DISCHARGE:                                 OPERATIVE REPORT   PREOPERATIVE DIAGNOSES:  Stage 1 carcinoma of the right breast.   POSTOPERATIVE DIAGNOSIS:  Stage 1 carcinoma of the right breast.   PROCEDURES:  1.  Blue dye injection.  2.  Right partial mastectomy with needle localization for specimen      mammogram.  3.  Right axillary sentinel lymph node biopsy.   SURGEON:  Rose Phi. Maple Hudson, M.D.   ANESTHESIA:  General.   OPERATIVE PROCEDURE:  Prior to coming to the operating room, a localizing  wire had been placed in the right breast at about the 9 o'clock position  where her primary tumor was.  In addition, a 1 millicurie of technetium  sulfur colloid was injected intradermally.   After suitable general anesthesia was induced, the patient was placed in the  supine position with the arms extended on the arm board; 5 cc of a mixture  of  2 cc of methylene blue and 3 cc of injectable saline was injected in the  subareolar tissue and the breast gently massaged for three minutes. We then  prepped and draped in a standard fashion.   With the wire at the 9 o'clock position, a radial incision was then outlined  with a marking pencil and then I incised that and with the wire in the  incision, we did a wide excision of the wire and surrounding tissue.  Specimen was oriented for the pathologist and then submitted for a specimen  mammogram.   While that was being done, a short transverse axillary incision was made  with dissection through the subcutaneous tissue to the clavipectoral  fashion.  Deep to the fascia were two blue and hot lymph nodes which were  removed as sentinel nodes. There were no other  palpable blue or hot nodes.   Specimen mammogram confirmed  the removal of the lesion.   The sentinel node was reported as negative for metastatic disease.  The  lumpectomy specimen looked like the margins were all clear except the  pathologist thought it was very close at the superior margin. I then excised  more of the superior margin as additional tissue.   Both incisions were then infiltrated with a local anesthetic mixture and  closed in two layers of 3-0 Vicryl and subcuticular 4-0 Monocryl and Steri-  Strips. Dressing applied.  The patient then transferred to the recovery room  in satisfactory condition having tolerated the procedure well.      Rose Phi. Maple Hudson, M.D.  Electronically Signed     PRY/MEDQ  D:  05/19/2006  T:  05/19/2006  Job:  [id number]

## 2011-04-29 ENCOUNTER — Ambulatory Visit
Admission: RE | Admit: 2011-04-29 | Discharge: 2011-04-29 | Disposition: A | Discharge status: Home / Self Care | Payer: BC Managed Care – PPO | Source: Ambulatory Visit | Attending: Oncology | Admitting: Oncology

## 2011-04-29 DIAGNOSIS — Z9889 Other specified postprocedural states: Secondary | ICD-10-CM

## 2011-07-04 ENCOUNTER — Other Ambulatory Visit: Payer: Self-pay | Admitting: Oncology

## 2011-07-04 ENCOUNTER — Encounter (HOSPITAL_BASED_OUTPATIENT_CLINIC_OR_DEPARTMENT_OTHER): Payer: BC Managed Care – PPO | Admitting: Oncology

## 2011-07-04 DIAGNOSIS — C50419 Malignant neoplasm of upper-outer quadrant of unspecified female breast: Secondary | ICD-10-CM

## 2011-07-04 DIAGNOSIS — Z170 Estrogen receptor positive status [ER+]: Secondary | ICD-10-CM

## 2011-07-04 LAB — CBC WITH DIFFERENTIAL/PLATELET
Basophils Absolute: 0 10*3/uL (ref 0.0–0.1)
Eosinophils Absolute: 0.1 10*3/uL (ref 0.0–0.5)
HCT: 37.8 % (ref 34.8–46.6)
HGB: 12.8 g/dL (ref 11.6–15.9)
LYMPH%: 10 % — ABNORMAL LOW (ref 14.0–49.7)
MCV: 90.3 fL (ref 79.5–101.0)
MONO#: 0.4 10*3/uL (ref 0.1–0.9)
MONO%: 3.4 % (ref 0.0–14.0)
NEUT#: 9.2 10*3/uL — ABNORMAL HIGH (ref 1.5–6.5)
Platelets: 278 10*3/uL (ref 145–400)
WBC: 10.7 10*3/uL — ABNORMAL HIGH (ref 3.9–10.3)

## 2011-07-05 LAB — COMPREHENSIVE METABOLIC PANEL
Albumin: 4.3 g/dL (ref 3.5–5.2)
Alkaline Phosphatase: 66 U/L (ref 39–117)
BUN: 17 mg/dL (ref 6–23)
CO2: 25 mEq/L (ref 19–32)
Glucose, Bld: 103 mg/dL — ABNORMAL HIGH (ref 70–99)
Total Bilirubin: 0.4 mg/dL (ref 0.3–1.2)
Total Protein: 7.1 g/dL (ref 6.0–8.3)

## 2011-07-05 LAB — VITAMIN D 25 HYDROXY (VIT D DEFICIENCY, FRACTURES): Vit D, 25-Hydroxy: 53 ng/mL (ref 30–89)

## 2011-07-05 LAB — CANCER ANTIGEN 27.29: CA 27.29: 18 U/mL (ref 0–39)

## 2011-07-08 ENCOUNTER — Encounter (HOSPITAL_BASED_OUTPATIENT_CLINIC_OR_DEPARTMENT_OTHER): Payer: BC Managed Care – PPO | Admitting: Oncology

## 2011-07-08 DIAGNOSIS — R21 Rash and other nonspecific skin eruption: Secondary | ICD-10-CM

## 2011-07-08 DIAGNOSIS — C50919 Malignant neoplasm of unspecified site of unspecified female breast: Secondary | ICD-10-CM

## 2011-07-08 DIAGNOSIS — Z170 Estrogen receptor positive status [ER+]: Secondary | ICD-10-CM

## 2012-01-08 ENCOUNTER — Other Ambulatory Visit: Payer: Self-pay | Admitting: Dermatology

## 2012-01-17 ENCOUNTER — Other Ambulatory Visit: Payer: Self-pay | Admitting: Oncology

## 2012-01-17 DIAGNOSIS — Z853 Personal history of malignant neoplasm of breast: Secondary | ICD-10-CM

## 2012-01-17 DIAGNOSIS — Z9889 Other specified postprocedural states: Secondary | ICD-10-CM

## 2012-01-30 ENCOUNTER — Other Ambulatory Visit: Payer: Self-pay | Admitting: Family Medicine

## 2012-01-30 DIAGNOSIS — I728 Aneurysm of other specified arteries: Secondary | ICD-10-CM

## 2012-02-17 ENCOUNTER — Other Ambulatory Visit: Payer: 59

## 2012-02-27 ENCOUNTER — Ambulatory Visit
Admission: RE | Admit: 2012-02-27 | Discharge: 2012-02-27 | Disposition: A | Discharge status: Home / Self Care | Payer: PRIVATE HEALTH INSURANCE | Source: Ambulatory Visit | Attending: Family Medicine | Admitting: Family Medicine

## 2012-02-27 DIAGNOSIS — I728 Aneurysm of other specified arteries: Secondary | ICD-10-CM

## 2012-02-27 MED ORDER — IOHEXOL 300 MG/ML  SOLN
75.0000 mL | Freq: Once | INTRAMUSCULAR | Status: AC | PRN
  Administered 2012-02-27: 75 mL via INTRAVENOUS

## 2012-04-29 ENCOUNTER — Ambulatory Visit
Admission: RE | Admit: 2012-04-29 | Discharge: 2012-04-29 | Disposition: A | Discharge status: Home / Self Care | Payer: PRIVATE HEALTH INSURANCE | Source: Ambulatory Visit | Attending: Oncology | Admitting: Oncology

## 2012-04-29 DIAGNOSIS — Z9889 Other specified postprocedural states: Secondary | ICD-10-CM

## 2012-04-29 DIAGNOSIS — Z853 Personal history of malignant neoplasm of breast: Secondary | ICD-10-CM

## 2012-06-03 ENCOUNTER — Ambulatory Visit: Payer: PRIVATE HEALTH INSURANCE | Attending: Family Medicine | Admitting: Physical Therapy

## 2012-06-03 DIAGNOSIS — M25659 Stiffness of unspecified hip, not elsewhere classified: Secondary | ICD-10-CM | POA: Insufficient documentation

## 2012-06-03 DIAGNOSIS — M25559 Pain in unspecified hip: Secondary | ICD-10-CM | POA: Insufficient documentation

## 2012-06-03 DIAGNOSIS — IMO0001 Reserved for inherently not codable concepts without codable children: Secondary | ICD-10-CM | POA: Insufficient documentation

## 2012-06-08 ENCOUNTER — Ambulatory Visit: Payer: PRIVATE HEALTH INSURANCE | Admitting: Physical Therapy

## 2012-06-10 ENCOUNTER — Ambulatory Visit: Payer: PRIVATE HEALTH INSURANCE | Admitting: Physical Therapy

## 2012-06-12 ENCOUNTER — Ambulatory Visit: Payer: PRIVATE HEALTH INSURANCE | Admitting: Physical Therapy

## 2012-06-15 ENCOUNTER — Ambulatory Visit: Payer: PRIVATE HEALTH INSURANCE | Admitting: Physical Therapy

## 2012-06-17 ENCOUNTER — Ambulatory Visit: Payer: PRIVATE HEALTH INSURANCE | Admitting: Physical Therapy

## 2012-10-14 ENCOUNTER — Other Ambulatory Visit: Payer: Self-pay | Admitting: Family Medicine

## 2012-10-14 DIAGNOSIS — R1011 Right upper quadrant pain: Secondary | ICD-10-CM

## 2012-10-19 ENCOUNTER — Ambulatory Visit
Admission: RE | Admit: 2012-10-19 | Discharge: 2012-10-19 | Disposition: A | Discharge status: Home / Self Care | Payer: PRIVATE HEALTH INSURANCE | Source: Ambulatory Visit | Attending: Family Medicine | Admitting: Family Medicine

## 2012-10-19 DIAGNOSIS — R1011 Right upper quadrant pain: Secondary | ICD-10-CM

## 2013-01-13 ENCOUNTER — Other Ambulatory Visit: Payer: Self-pay | Admitting: Dermatology

## 2013-02-15 ENCOUNTER — Encounter (INDEPENDENT_AMBULATORY_CARE_PROVIDER_SITE_OTHER): Payer: PRIVATE HEALTH INSURANCE | Admitting: Ophthalmology

## 2013-02-15 DIAGNOSIS — H3530 Unspecified macular degeneration: Secondary | ICD-10-CM

## 2013-02-15 DIAGNOSIS — H4310 Vitreous hemorrhage, unspecified eye: Secondary | ICD-10-CM

## 2013-02-15 DIAGNOSIS — H2510 Age-related nuclear cataract, unspecified eye: Secondary | ICD-10-CM

## 2013-02-15 DIAGNOSIS — H43819 Vitreous degeneration, unspecified eye: Secondary | ICD-10-CM

## 2013-02-23 ENCOUNTER — Other Ambulatory Visit: Payer: Self-pay | Admitting: Obstetrics & Gynecology

## 2013-02-23 DIAGNOSIS — Z853 Personal history of malignant neoplasm of breast: Secondary | ICD-10-CM

## 2013-03-29 ENCOUNTER — Encounter (INDEPENDENT_AMBULATORY_CARE_PROVIDER_SITE_OTHER): Payer: PRIVATE HEALTH INSURANCE | Admitting: Ophthalmology

## 2013-03-29 DIAGNOSIS — H2510 Age-related nuclear cataract, unspecified eye: Secondary | ICD-10-CM

## 2013-03-29 DIAGNOSIS — H43819 Vitreous degeneration, unspecified eye: Secondary | ICD-10-CM

## 2013-03-29 DIAGNOSIS — H3530 Unspecified macular degeneration: Secondary | ICD-10-CM

## 2013-05-05 ENCOUNTER — Ambulatory Visit
Admission: RE | Admit: 2013-05-05 | Discharge: 2013-05-05 | Disposition: A | Discharge status: Home / Self Care | Payer: PRIVATE HEALTH INSURANCE | Source: Ambulatory Visit | Attending: Obstetrics & Gynecology | Admitting: Obstetrics & Gynecology

## 2013-05-05 DIAGNOSIS — Z853 Personal history of malignant neoplasm of breast: Secondary | ICD-10-CM

## 2014-02-11 ENCOUNTER — Other Ambulatory Visit: Payer: Self-pay

## 2014-02-11 DIAGNOSIS — Z9889 Other specified postprocedural states: Secondary | ICD-10-CM

## 2014-02-11 DIAGNOSIS — Z853 Personal history of malignant neoplasm of breast: Secondary | ICD-10-CM

## 2014-02-11 DIAGNOSIS — Z1231 Encounter for screening mammogram for malignant neoplasm of breast: Secondary | ICD-10-CM

## 2014-05-16 DIAGNOSIS — M899 Disorder of bone, unspecified: Secondary | ICD-10-CM | POA: Diagnosis not present

## 2014-05-16 DIAGNOSIS — Z0000 Encounter for general adult medical examination without abnormal findings: Secondary | ICD-10-CM | POA: Diagnosis not present

## 2014-05-16 DIAGNOSIS — E782 Mixed hyperlipidemia: Secondary | ICD-10-CM | POA: Diagnosis not present

## 2014-05-16 DIAGNOSIS — K219 Gastro-esophageal reflux disease without esophagitis: Secondary | ICD-10-CM | POA: Diagnosis not present

## 2014-05-16 DIAGNOSIS — E559 Vitamin D deficiency, unspecified: Secondary | ICD-10-CM | POA: Diagnosis not present

## 2014-05-16 DIAGNOSIS — Z1211 Encounter for screening for malignant neoplasm of colon: Secondary | ICD-10-CM | POA: Diagnosis not present

## 2014-05-16 DIAGNOSIS — Z20828 Contact with and (suspected) exposure to other viral communicable diseases: Secondary | ICD-10-CM | POA: Diagnosis not present

## 2014-05-24 ENCOUNTER — Ambulatory Visit: Payer: PRIVATE HEALTH INSURANCE

## 2014-05-24 ENCOUNTER — Encounter (INDEPENDENT_AMBULATORY_CARE_PROVIDER_SITE_OTHER): Payer: Self-pay

## 2014-05-24 ENCOUNTER — Ambulatory Visit
Admission: RE | Admit: 2014-05-24 | Discharge: 2014-05-24 | Disposition: A | Discharge status: Home / Self Care | Payer: PRIVATE HEALTH INSURANCE | Source: Ambulatory Visit

## 2014-05-24 DIAGNOSIS — Z853 Personal history of malignant neoplasm of breast: Secondary | ICD-10-CM

## 2014-05-24 DIAGNOSIS — Z1231 Encounter for screening mammogram for malignant neoplasm of breast: Secondary | ICD-10-CM

## 2014-05-24 DIAGNOSIS — Z9889 Other specified postprocedural states: Secondary | ICD-10-CM

## 2014-05-25 DIAGNOSIS — M899 Disorder of bone, unspecified: Secondary | ICD-10-CM | POA: Diagnosis not present

## 2014-05-25 DIAGNOSIS — E782 Mixed hyperlipidemia: Secondary | ICD-10-CM | POA: Diagnosis not present

## 2014-05-25 DIAGNOSIS — K219 Gastro-esophageal reflux disease without esophagitis: Secondary | ICD-10-CM | POA: Diagnosis not present

## 2014-05-25 DIAGNOSIS — Z23 Encounter for immunization: Secondary | ICD-10-CM | POA: Diagnosis not present

## 2014-05-25 DIAGNOSIS — I728 Aneurysm of other specified arteries: Secondary | ICD-10-CM | POA: Diagnosis not present

## 2014-05-25 DIAGNOSIS — Z0000 Encounter for general adult medical examination without abnormal findings: Secondary | ICD-10-CM | POA: Diagnosis not present

## 2014-05-25 DIAGNOSIS — Z1211 Encounter for screening for malignant neoplasm of colon: Secondary | ICD-10-CM | POA: Diagnosis not present

## 2014-05-25 DIAGNOSIS — C50919 Malignant neoplasm of unspecified site of unspecified female breast: Secondary | ICD-10-CM | POA: Diagnosis not present

## 2014-05-25 DIAGNOSIS — Z1331 Encounter for screening for depression: Secondary | ICD-10-CM | POA: Diagnosis not present

## 2014-05-26 ENCOUNTER — Other Ambulatory Visit: Payer: Self-pay | Admitting: Dermatology

## 2014-05-26 DIAGNOSIS — L719 Rosacea, unspecified: Secondary | ICD-10-CM | POA: Diagnosis not present

## 2014-05-26 DIAGNOSIS — Z85820 Personal history of malignant melanoma of skin: Secondary | ICD-10-CM | POA: Diagnosis not present

## 2014-05-26 DIAGNOSIS — L820 Inflamed seborrheic keratosis: Secondary | ICD-10-CM | POA: Diagnosis not present

## 2014-05-26 DIAGNOSIS — L821 Other seborrheic keratosis: Secondary | ICD-10-CM | POA: Diagnosis not present

## 2014-05-26 DIAGNOSIS — D239 Other benign neoplasm of skin, unspecified: Secondary | ICD-10-CM | POA: Diagnosis not present

## 2014-05-26 DIAGNOSIS — D485 Neoplasm of uncertain behavior of skin: Secondary | ICD-10-CM | POA: Diagnosis not present

## 2014-06-08 ENCOUNTER — Other Ambulatory Visit: Payer: Self-pay | Admitting: Dermatology

## 2014-06-08 DIAGNOSIS — D485 Neoplasm of uncertain behavior of skin: Secondary | ICD-10-CM | POA: Diagnosis not present

## 2015-02-28 ENCOUNTER — Other Ambulatory Visit: Payer: Self-pay

## 2015-02-28 DIAGNOSIS — Z1231 Encounter for screening mammogram for malignant neoplasm of breast: Secondary | ICD-10-CM

## 2015-05-26 ENCOUNTER — Ambulatory Visit: Payer: PRIVATE HEALTH INSURANCE

## 2015-05-30 ENCOUNTER — Other Ambulatory Visit: Payer: Self-pay | Admitting: Family Medicine

## 2015-05-30 DIAGNOSIS — Z853 Personal history of malignant neoplasm of breast: Secondary | ICD-10-CM | POA: Diagnosis not present

## 2015-05-30 DIAGNOSIS — I712 Thoracic aortic aneurysm, without rupture, unspecified: Secondary | ICD-10-CM

## 2015-05-30 DIAGNOSIS — Z85820 Personal history of malignant melanoma of skin: Secondary | ICD-10-CM | POA: Diagnosis not present

## 2015-05-30 DIAGNOSIS — Z0000 Encounter for general adult medical examination without abnormal findings: Secondary | ICD-10-CM | POA: Diagnosis not present

## 2015-05-30 DIAGNOSIS — K219 Gastro-esophageal reflux disease without esophagitis: Secondary | ICD-10-CM | POA: Diagnosis not present

## 2015-05-30 DIAGNOSIS — E559 Vitamin D deficiency, unspecified: Secondary | ICD-10-CM | POA: Diagnosis not present

## 2015-05-30 DIAGNOSIS — M85852 Other specified disorders of bone density and structure, left thigh: Secondary | ICD-10-CM | POA: Diagnosis not present

## 2015-05-30 DIAGNOSIS — E782 Mixed hyperlipidemia: Secondary | ICD-10-CM | POA: Diagnosis not present

## 2015-06-01 ENCOUNTER — Other Ambulatory Visit: Payer: Self-pay | Admitting: Obstetrics & Gynecology

## 2015-06-01 DIAGNOSIS — Z6827 Body mass index (BMI) 27.0-27.9, adult: Secondary | ICD-10-CM | POA: Diagnosis not present

## 2015-06-01 DIAGNOSIS — Z01419 Encounter for gynecological examination (general) (routine) without abnormal findings: Secondary | ICD-10-CM | POA: Diagnosis not present

## 2015-06-02 ENCOUNTER — Ambulatory Visit
Admission: RE | Admit: 2015-06-02 | Discharge: 2015-06-02 | Disposition: A | Discharge status: Home / Self Care | Payer: PRIVATE HEALTH INSURANCE | Source: Ambulatory Visit

## 2015-06-02 DIAGNOSIS — Z1231 Encounter for screening mammogram for malignant neoplasm of breast: Secondary | ICD-10-CM

## 2015-06-02 LAB — CYTOLOGY - PAP

## 2015-06-09 ENCOUNTER — Ambulatory Visit
Admission: RE | Admit: 2015-06-09 | Discharge: 2015-06-09 | Disposition: A | Discharge status: Home / Self Care | Payer: PRIVATE HEALTH INSURANCE | Source: Ambulatory Visit | Attending: Family Medicine | Admitting: Family Medicine

## 2015-06-09 DIAGNOSIS — I712 Thoracic aortic aneurysm, without rupture, unspecified: Secondary | ICD-10-CM

## 2015-06-09 DIAGNOSIS — I77810 Thoracic aortic ectasia: Secondary | ICD-10-CM | POA: Diagnosis not present

## 2015-06-09 DIAGNOSIS — J984 Other disorders of lung: Secondary | ICD-10-CM | POA: Diagnosis not present

## 2015-07-13 DIAGNOSIS — Z85820 Personal history of malignant melanoma of skin: Secondary | ICD-10-CM | POA: Diagnosis not present

## 2015-07-13 DIAGNOSIS — L821 Other seborrheic keratosis: Secondary | ICD-10-CM | POA: Diagnosis not present

## 2015-07-13 DIAGNOSIS — Z86018 Personal history of other benign neoplasm: Secondary | ICD-10-CM | POA: Diagnosis not present

## 2015-07-13 DIAGNOSIS — D225 Melanocytic nevi of trunk: Secondary | ICD-10-CM | POA: Diagnosis not present

## 2015-08-05 DIAGNOSIS — R42 Dizziness and giddiness: Secondary | ICD-10-CM | POA: Diagnosis not present

## 2015-09-06 DIAGNOSIS — L259 Unspecified contact dermatitis, unspecified cause: Secondary | ICD-10-CM | POA: Diagnosis not present

## 2015-09-15 DIAGNOSIS — L739 Follicular disorder, unspecified: Secondary | ICD-10-CM | POA: Diagnosis not present

## 2015-12-18 ENCOUNTER — Telehealth (HOSPITAL_COMMUNITY): Payer: Self-pay

## 2015-12-18 DIAGNOSIS — M62838 Other muscle spasm: Secondary | ICD-10-CM | POA: Diagnosis not present

## 2015-12-18 NOTE — Telephone Encounter (Signed)
Patient called and reported that she has pain in neck and in clavicles for about a week. Since calling here she has made an OV with her primary care. Last OV in 2012 with Dr. Haroldine Laws Patient would like to know what cardiologist her would refer he to know that he is involved primarily in this new service. Will give patient Dr. Joelyn Oms name for a cardiologist at [facility]

## 2016-02-29 ENCOUNTER — Other Ambulatory Visit: Payer: Self-pay

## 2016-02-29 DIAGNOSIS — Z1231 Encounter for screening mammogram for malignant neoplasm of breast: Secondary | ICD-10-CM

## 2016-03-24 DIAGNOSIS — J069 Acute upper respiratory infection, unspecified: Secondary | ICD-10-CM | POA: Diagnosis not present

## 2016-04-12 DIAGNOSIS — N3000 Acute cystitis without hematuria: Secondary | ICD-10-CM | POA: Diagnosis not present

## 2016-04-12 DIAGNOSIS — R300 Dysuria: Secondary | ICD-10-CM | POA: Diagnosis not present

## 2016-05-28 ENCOUNTER — Other Ambulatory Visit: Payer: Self-pay | Admitting: Family Medicine

## 2016-05-28 DIAGNOSIS — I712 Thoracic aortic aneurysm, without rupture, unspecified: Secondary | ICD-10-CM

## 2016-05-31 DIAGNOSIS — E782 Mixed hyperlipidemia: Secondary | ICD-10-CM | POA: Diagnosis not present

## 2016-05-31 DIAGNOSIS — R300 Dysuria: Secondary | ICD-10-CM | POA: Diagnosis not present

## 2016-05-31 DIAGNOSIS — E559 Vitamin D deficiency, unspecified: Secondary | ICD-10-CM | POA: Diagnosis not present

## 2016-05-31 DIAGNOSIS — J069 Acute upper respiratory infection, unspecified: Secondary | ICD-10-CM | POA: Diagnosis not present

## 2016-06-03 ENCOUNTER — Ambulatory Visit
Admission: RE | Admit: 2016-06-03 | Discharge: 2016-06-03 | Disposition: A | Discharge status: Home / Self Care | Payer: Medicare Other | Source: Ambulatory Visit

## 2016-06-03 DIAGNOSIS — Z1231 Encounter for screening mammogram for malignant neoplasm of breast: Secondary | ICD-10-CM

## 2016-06-04 DIAGNOSIS — Z01419 Encounter for gynecological examination (general) (routine) without abnormal findings: Secondary | ICD-10-CM | POA: Diagnosis not present

## 2016-06-04 DIAGNOSIS — Z803 Family history of malignant neoplasm of breast: Secondary | ICD-10-CM | POA: Diagnosis not present

## 2016-06-04 DIAGNOSIS — Z6826 Body mass index (BMI) 26.0-26.9, adult: Secondary | ICD-10-CM | POA: Diagnosis not present

## 2016-06-10 ENCOUNTER — Encounter: Payer: Self-pay | Admitting: Radiology

## 2016-06-10 ENCOUNTER — Ambulatory Visit
Admission: RE | Admit: 2016-06-10 | Discharge: 2016-06-10 | Disposition: A | Discharge status: Home / Self Care | Payer: PRIVATE HEALTH INSURANCE | Source: Ambulatory Visit | Attending: Family Medicine | Admitting: Family Medicine

## 2016-06-10 DIAGNOSIS — I712 Thoracic aortic aneurysm, without rupture, unspecified: Secondary | ICD-10-CM

## 2016-06-10 MED ORDER — IOPAMIDOL (ISOVUE-370) INJECTION 76%
75.0000 mL | Freq: Once | INTRAVENOUS | Status: AC | PRN
  Administered 2016-06-10: 75 mL via INTRAVENOUS

## 2016-07-25 DIAGNOSIS — D1800 Hemangioma unspecified site: Secondary | ICD-10-CM | POA: Diagnosis not present

## 2016-07-25 DIAGNOSIS — Z85820 Personal history of malignant melanoma of skin: Secondary | ICD-10-CM | POA: Diagnosis not present

## 2016-07-25 DIAGNOSIS — D2262 Melanocytic nevi of left upper limb, including shoulder: Secondary | ICD-10-CM | POA: Diagnosis not present

## 2016-07-25 DIAGNOSIS — Z86018 Personal history of other benign neoplasm: Secondary | ICD-10-CM | POA: Diagnosis not present

## 2016-07-25 DIAGNOSIS — L821 Other seborrheic keratosis: Secondary | ICD-10-CM | POA: Diagnosis not present

## 2016-07-25 DIAGNOSIS — D225 Melanocytic nevi of trunk: Secondary | ICD-10-CM | POA: Diagnosis not present

## 2016-07-25 DIAGNOSIS — L814 Other melanin hyperpigmentation: Secondary | ICD-10-CM | POA: Diagnosis not present

## 2016-07-25 DIAGNOSIS — D2272 Melanocytic nevi of left lower limb, including hip: Secondary | ICD-10-CM | POA: Diagnosis not present

## 2016-07-25 DIAGNOSIS — D485 Neoplasm of uncertain behavior of skin: Secondary | ICD-10-CM | POA: Diagnosis not present

## 2016-09-02 DIAGNOSIS — R42 Dizziness and giddiness: Secondary | ICD-10-CM | POA: Diagnosis not present

## 2016-09-02 DIAGNOSIS — R531 Weakness: Secondary | ICD-10-CM | POA: Diagnosis not present

## 2016-09-02 DIAGNOSIS — F99 Mental disorder, not otherwise specified: Secondary | ICD-10-CM | POA: Diagnosis not present

## 2017-03-04 ENCOUNTER — Other Ambulatory Visit: Payer: Self-pay | Admitting: Family Medicine

## 2017-03-04 DIAGNOSIS — Z1231 Encounter for screening mammogram for malignant neoplasm of breast: Secondary | ICD-10-CM

## 2017-03-06 DIAGNOSIS — D225 Melanocytic nevi of trunk: Secondary | ICD-10-CM | POA: Diagnosis not present

## 2017-03-06 DIAGNOSIS — M7130 Other bursal cyst, unspecified site: Secondary | ICD-10-CM | POA: Diagnosis not present

## 2017-03-10 DIAGNOSIS — M6740 Ganglion, unspecified site: Secondary | ICD-10-CM | POA: Diagnosis not present

## 2017-03-10 DIAGNOSIS — M19041 Primary osteoarthritis, right hand: Secondary | ICD-10-CM | POA: Diagnosis not present

## 2017-03-10 DIAGNOSIS — M67441 Ganglion, right hand: Secondary | ICD-10-CM | POA: Diagnosis not present

## 2017-03-10 DIAGNOSIS — M151 Heberden's nodes (with arthropathy): Secondary | ICD-10-CM | POA: Diagnosis not present

## 2017-03-11 ENCOUNTER — Other Ambulatory Visit: Payer: Self-pay | Admitting: Orthopedic Surgery

## 2017-03-17 ENCOUNTER — Encounter (HOSPITAL_BASED_OUTPATIENT_CLINIC_OR_DEPARTMENT_OTHER): Payer: Self-pay | Admitting: *Deleted

## 2017-03-21 ENCOUNTER — Encounter (HOSPITAL_BASED_OUTPATIENT_CLINIC_OR_DEPARTMENT_OTHER): Payer: Self-pay | Admitting: *Deleted

## 2017-03-24 ENCOUNTER — Encounter (HOSPITAL_BASED_OUTPATIENT_CLINIC_OR_DEPARTMENT_OTHER): Payer: Self-pay | Admitting: Anesthesiology

## 2017-03-25 ENCOUNTER — Ambulatory Visit (HOSPITAL_BASED_OUTPATIENT_CLINIC_OR_DEPARTMENT_OTHER): Payer: PRIVATE HEALTH INSURANCE | Admitting: Anesthesiology

## 2017-03-25 ENCOUNTER — Encounter (HOSPITAL_BASED_OUTPATIENT_CLINIC_OR_DEPARTMENT_OTHER): Payer: Self-pay

## 2017-03-25 ENCOUNTER — Ambulatory Visit (HOSPITAL_BASED_OUTPATIENT_CLINIC_OR_DEPARTMENT_OTHER)
Admission: RE | Admit: 2017-03-25 | Discharge: 2017-03-25 | Disposition: A | Discharge status: Home / Self Care | Payer: PRIVATE HEALTH INSURANCE | Source: Ambulatory Visit | Attending: Orthopedic Surgery | Admitting: Orthopedic Surgery

## 2017-03-25 ENCOUNTER — Encounter (HOSPITAL_BASED_OUTPATIENT_CLINIC_OR_DEPARTMENT_OTHER)
Admission: RE | Disposition: A | Discharge status: Home / Self Care | Payer: Self-pay | Source: Ambulatory Visit | Attending: Orthopedic Surgery

## 2017-03-25 DIAGNOSIS — I712 Thoracic aortic aneurysm, without rupture: Secondary | ICD-10-CM | POA: Diagnosis not present

## 2017-03-25 DIAGNOSIS — M25841 Other specified joint disorders, right hand: Secondary | ICD-10-CM | POA: Insufficient documentation

## 2017-03-25 DIAGNOSIS — I10 Essential (primary) hypertension: Secondary | ICD-10-CM | POA: Diagnosis not present

## 2017-03-25 DIAGNOSIS — Z85820 Personal history of malignant melanoma of skin: Secondary | ICD-10-CM | POA: Insufficient documentation

## 2017-03-25 DIAGNOSIS — Z7982 Long term (current) use of aspirin: Secondary | ICD-10-CM | POA: Insufficient documentation

## 2017-03-25 DIAGNOSIS — M19041 Primary osteoarthritis, right hand: Secondary | ICD-10-CM | POA: Insufficient documentation

## 2017-03-25 DIAGNOSIS — Z853 Personal history of malignant neoplasm of breast: Secondary | ICD-10-CM | POA: Insufficient documentation

## 2017-03-25 DIAGNOSIS — D2111 Benign neoplasm of connective and other soft tissue of right upper limb, including shoulder: Secondary | ICD-10-CM | POA: Diagnosis not present

## 2017-03-25 DIAGNOSIS — M25741 Osteophyte, right hand: Secondary | ICD-10-CM | POA: Diagnosis not present

## 2017-03-25 DIAGNOSIS — M151 Heberden's nodes (with arthropathy): Secondary | ICD-10-CM | POA: Diagnosis not present

## 2017-03-25 DIAGNOSIS — L729 Follicular cyst of the skin and subcutaneous tissue, unspecified: Secondary | ICD-10-CM | POA: Diagnosis not present

## 2017-03-25 HISTORY — PX: MASS EXCISION: SHX2000

## 2017-03-25 SURGERY — EXCISION MASS
Anesthesia: Regional | Site: Finger | Laterality: Right

## 2017-03-25 MED ORDER — SCOPOLAMINE 1 MG/3DAYS TD PT72
1.0000 | MEDICATED_PATCH | Freq: Once | TRANSDERMAL | Status: DC | PRN
Start: 2017-03-25 — End: 2017-03-25

## 2017-03-25 MED ORDER — LIDOCAINE HCL (PF) 0.5 % IJ SOLN
INTRAMUSCULAR | Status: DC | PRN
  Administered 2017-03-25: 30 mL via INTRAVENOUS

## 2017-03-25 MED ORDER — MIDAZOLAM HCL 2 MG/2ML IJ SOLN
INTRAMUSCULAR | Status: AC
  Filled 2017-03-25: qty 2

## 2017-03-25 MED ORDER — MIDAZOLAM HCL 2 MG/2ML IJ SOLN: 1.0000 mg | INTRAMUSCULAR | Status: DC | PRN

## 2017-03-25 MED ORDER — LACTATED RINGERS IV SOLN
INTRAVENOUS | Status: DC
  Administered 2017-03-25 (×2): via INTRAVENOUS

## 2017-03-25 MED ORDER — OXYCODONE HCL 5 MG/5ML PO SOLN: 5.0000 mg | Freq: Once | ORAL | Status: DC | PRN

## 2017-03-25 MED ORDER — OXYCODONE HCL 5 MG PO TABS: 5.0000 mg | ORAL_TABLET | Freq: Once | ORAL | Status: DC | PRN

## 2017-03-25 MED ORDER — CHLORHEXIDINE GLUCONATE 4 % EX LIQD: 60.0000 mL | Freq: Once | CUTANEOUS | Status: DC

## 2017-03-25 MED ORDER — CEFAZOLIN SODIUM-DEXTROSE 2-4 GM/100ML-% IV SOLN
2.0000 g | INTRAVENOUS | Status: AC
  Administered 2017-03-25: 2 g via INTRAVENOUS

## 2017-03-25 MED ORDER — LIDOCAINE 2% (20 MG/ML) 5 ML SYRINGE
INTRAMUSCULAR | Status: AC
  Filled 2017-03-25: qty 5

## 2017-03-25 MED ORDER — ONDANSETRON HCL 4 MG/2ML IJ SOLN
INTRAMUSCULAR | Status: AC
  Filled 2017-03-25: qty 2

## 2017-03-25 MED ORDER — PROMETHAZINE HCL 25 MG/ML IJ SOLN: 6.2500 mg | INTRAMUSCULAR | Status: DC | PRN

## 2017-03-25 MED ORDER — FENTANYL CITRATE (PF) 100 MCG/2ML IJ SOLN: 50.0000 ug | INTRAMUSCULAR | Status: DC | PRN

## 2017-03-25 MED ORDER — TRAMADOL HCL 50 MG PO TABS: 50.0000 mg | ORAL_TABLET | Freq: Four times a day (QID) | ORAL | 0 refills | Status: DC | PRN

## 2017-03-25 MED ORDER — FENTANYL CITRATE (PF) 100 MCG/2ML IJ SOLN
INTRAMUSCULAR | Status: AC
  Filled 2017-03-25: qty 2

## 2017-03-25 MED ORDER — HYDROMORPHONE HCL 1 MG/ML IJ SOLN: 0.2500 mg | INTRAMUSCULAR | Status: DC | PRN

## 2017-03-25 MED ORDER — CEFAZOLIN SODIUM-DEXTROSE 2-4 GM/100ML-% IV SOLN
INTRAVENOUS | Status: AC
  Filled 2017-03-25: qty 100

## 2017-03-25 MED ORDER — PROPOFOL 10 MG/ML IV BOLUS
INTRAVENOUS | Status: AC
  Filled 2017-03-25: qty 20

## 2017-03-25 MED ORDER — BUPIVACAINE HCL (PF) 0.25 % IJ SOLN
INTRAMUSCULAR | Status: DC | PRN
  Administered 2017-03-25: 9 mL

## 2017-03-25 MED ORDER — ONDANSETRON HCL 4 MG/2ML IJ SOLN
INTRAMUSCULAR | Status: DC | PRN
Start: 2017-03-25 — End: 2017-03-25
  Administered 2017-03-25: 4 mg via INTRAVENOUS

## 2017-03-25 SURGICAL SUPPLY — 47 items
BANDAGE COBAN STERILE 2 (GAUZE/BANDAGES/DRESSINGS) IMPLANT
BLADE SURG 15 STRL LF DISP TIS (BLADE) ×1 IMPLANT
BLADE SURG 15 STRL SS (BLADE) ×3
BNDG CMPR 9X4 STRL LF SNTH (GAUZE/BANDAGES/DRESSINGS)
BNDG COHESIVE 1X5 TAN STRL LF (GAUZE/BANDAGES/DRESSINGS) ×2 IMPLANT
BNDG COHESIVE 3X5 TAN STRL LF (GAUZE/BANDAGES/DRESSINGS) IMPLANT
BNDG ESMARK 4X9 LF (GAUZE/BANDAGES/DRESSINGS) IMPLANT
BNDG GAUZE ELAST 4 BULKY (GAUZE/BANDAGES/DRESSINGS) IMPLANT
CHLORAPREP W/TINT 26ML (MISCELLANEOUS) ×3 IMPLANT
CORDS BIPOLAR (ELECTRODE) ×3 IMPLANT
COVER BACK TABLE 60X90IN (DRAPES) ×3 IMPLANT
COVER MAYO STAND STRL (DRAPES) ×3 IMPLANT
CUFF TOURNIQUET SINGLE 18IN (TOURNIQUET CUFF) ×2 IMPLANT
DECANTER SPIKE VIAL GLASS SM (MISCELLANEOUS) IMPLANT
DRAIN PENROSE 1/2X12 LTX STRL (WOUND CARE) IMPLANT
DRAPE EXTREMITY T 121X128X90 (DRAPE) ×3 IMPLANT
DRAPE SURG 17X23 STRL (DRAPES) ×3 IMPLANT
GAUZE SPONGE 4X4 12PLY STRL (GAUZE/BANDAGES/DRESSINGS) ×3 IMPLANT
GAUZE XEROFORM 1X8 LF (GAUZE/BANDAGES/DRESSINGS) ×3 IMPLANT
GLOVE BIO SURGEON STRL SZ 6.5 (GLOVE) ×2 IMPLANT
GLOVE BIO SURGEONS STRL SZ 6.5 (GLOVE) ×2
GLOVE BIOGEL PI IND STRL 7.0 (GLOVE) IMPLANT
GLOVE BIOGEL PI IND STRL 8.5 (GLOVE) ×1 IMPLANT
GLOVE BIOGEL PI INDICATOR 7.0 (GLOVE) ×6
GLOVE BIOGEL PI INDICATOR 8.5 (GLOVE) ×2
GLOVE SURG ORTHO 8.0 STRL STRW (GLOVE) ×3 IMPLANT
GOWN STRL REUS W/ TWL LRG LVL3 (GOWN DISPOSABLE) ×1 IMPLANT
GOWN STRL REUS W/TWL LRG LVL3 (GOWN DISPOSABLE) ×6
GOWN STRL REUS W/TWL XL LVL3 (GOWN DISPOSABLE) ×3 IMPLANT
NDL PRECISIONGLIDE 27X1.5 (NEEDLE) ×1 IMPLANT
NDL SAFETY ECLIPSE 18X1.5 (NEEDLE) IMPLANT
NEEDLE HYPO 18GX1.5 SHARP (NEEDLE)
NEEDLE PRECISIONGLIDE 27X1.5 (NEEDLE) ×3 IMPLANT
NS IRRIG 1000ML POUR BTL (IV SOLUTION) ×3 IMPLANT
PACK BASIN DAY SURGERY FS (CUSTOM PROCEDURE TRAY) ×3 IMPLANT
PAD CAST 3X4 CTTN HI CHSV (CAST SUPPLIES) IMPLANT
PADDING CAST COTTON 3X4 STRL (CAST SUPPLIES)
SPLINT FINGER 3.25 BULB 911905 (SOFTGOODS) ×2 IMPLANT
SPLINT PLASTER CAST XFAST 3X15 (CAST SUPPLIES) IMPLANT
SPLINT PLASTER XTRA FASTSET 3X (CAST SUPPLIES)
STOCKINETTE 4X48 STRL (DRAPES) ×3 IMPLANT
SUT ETHILON 4 0 PS 2 18 (SUTURE) ×3 IMPLANT
SUT VIC AB 4-0 P2 18 (SUTURE) IMPLANT
SYR BULB 3OZ (MISCELLANEOUS) ×3 IMPLANT
SYR CONTROL 10ML LL (SYRINGE) ×3 IMPLANT
TOWEL OR 17X24 6PK STRL BLUE (TOWEL DISPOSABLE) ×3 IMPLANT
UNDERPAD 30X30 (UNDERPADS AND DIAPERS) ×3 IMPLANT

## 2017-03-25 NOTE — Brief Op Note (Signed)
03/25/2017  12:01 PM  PATIENT:  Moreen Fowler  71 y.o. female  PRE-OPERATIVE DIAGNOSIS:  MUCOID CYST RIGHT INDEX FINGER DEBRIDEMENT  POST-OPERATIVE DIAGNOSIS:  MUCOID CYST RIGHT INDEX FINGER DEBRIDEMENT  PROCEDURE:  Procedure(s) with comments: EXCISION MUCOID CYST WITH DISTAL PHANGEAL JOINT ARTHROTOMY RIGHT INDEX FINGER (Right) - REG/FAB  SURGEON:  Surgeon(s) and Role:    * Daryll Brod, MD - Primary  PHYSICIAN ASSISTANT:   ASSISTANTS: none   ANESTHESIA:   local, regional and IV sedation  EBL:  Total I/O In: -  Out: 2 [Blood:2]  BLOOD ADMINISTERED:none  DRAINS: none   LOCAL MEDICATIONS USED:  BUPIVICAINE   SPECIMEN:  Excision  DISPOSITION OF SPECIMEN:  PATHOLOGY  COUNTS:  YES  TOURNIQUET:   Total Tourniquet Time Documented: Forearm (Right) - 21 minutes Total: Forearm (Right) - 21 minutes   DICTATION: .Other Dictation: Dictation Number [id number]  PLAN OF CARE: Discharge to home after PACU  PATIENT DISPOSITION:  PACU - hemodynamically stable.

## 2017-03-25 NOTE — Transfer of Care (Signed)
Immediate Anesthesia Transfer of Care Note  Patient: Catherine Holland  Procedure(s) Performed: Procedure(s) with comments: EXCISION MUCOID CYST WITH DISTAL PHANGEAL JOINT ARTHROTOMY RIGHT INDEX FINGER (Right) - REG/FAB  Patient Location: PACU  Anesthesia Type:Bier block  Level of Consciousness: awake  Airway & Oxygen Therapy: Patient Spontanous Breathing  Post-op Assessment: Report given to RN and Post -op Vital signs reviewed and stable  Post vital signs: Reviewed and stable  Last Vitals:  Vitals:   03/25/17 1043  BP: (!) 143/85  Pulse: 62  Resp: 16  Temp: 36.6 C    Last Pain:  Vitals:   03/25/17 1043  TempSrc: Oral         Complications: No apparent anesthesia complications

## 2017-03-25 NOTE — Discharge Instructions (Addendum)
Postoperative Anesthesia Instructions-Pediatric ° °Activity: °Your child should rest for the remainder of the day. A responsible individual must stay with your child for 24 hours. ° °Meals: °Your child should start with liquids and light foods such as gelatin or soup unless otherwise instructed by the physician. Progress to regular foods as tolerated. Avoid spicy, greasy, and heavy foods. If nausea and/or vomiting occur, drink only clear liquids such as apple juice or Pedialyte until the nausea and/or vomiting subsides. Call your physician if vomiting continues. ° °Special Instructions/Symptoms: °Your child may be drowsy for the rest of the day, although some children experience some hyperactivity a few hours after the surgery. Your child may also experience some irritability or crying episodes due to the operative procedure and/or anesthesia. Your child's throat may feel dry or sore from the anesthesia or the breathing tube placed in the throat during surgery. Use throat lozenges, sprays, or ice chips if needed.  °  ° ° ° °[facility] Center Instructions °Hand Surgery ° °Wound Care: °Keep your hand elevated above the level of your heart.  Do not allow it to dangle by your side.  Keep the dressing dry and do not remove it unless your doctor advises you to do so.  He will usually change it at the time of your post-op visit.  Moving your fingers is advised to stimulate circulation but will depend on the site of your surgery.  If you have a splint applied, your doctor will advise you regarding movement. ° °Activity: °Do not drive or operate machinery today.  Rest today and then you may return to your normal activity and work as indicated by your physician. ° °Diet:  °Drink liquids today or eat a light diet.  You may resume a regular diet tomorrow.   ° °General expectations: °Pain for two to three days. °Fingers may become slightly swollen. ° °Call your doctor if any of the following occur: °Severe pain not relieved by pain  medication. °Elevated temperature. °Dressing soaked with blood. °Inability to move fingers. °White or bluish color to fingers. ° °

## 2017-03-25 NOTE — H&P (Signed)
Catherine Holland is an 71 y.o. female.   Chief Complaint: mass right index finger HPI: Catherine Holland is a 71 year old right-hand-dominant female referred by Dr. Renda Rolls for consultation regarding a mass on her right index finger which is been present for the past 6-7 months. She recalls no history of injury. It is because some deformity of her nail plate. She has only an occasional aching pain with a VAS score 2/10. She has not had any treatment or tried anything for this. She states that most of the pain occurs if she squeezes it to get it to go down in size. She has a history of arthritis no history diabetes arthritis thyroid problems or gout. Family history is positive arthritis negative for diabetes thyroid problems and gout.      Past Medical History:  Diagnosis Date  . Breast cancer ([place])    --s/p lumpectomy and XRT --Arimdex  . Chest pain, unspecified    --normal myoview in july 2010   . GERD (gastroesophageal reflux disease)   . Melanoma ([place])   . Tachycardia    -monitor 12/10. Sr with PACs --echo 12/10. EF normal. no signifcant valvular abnormalities. --ETT normal 4/11    . Thoracic aortic aneurysm [facility])     Past Surgical History:  Procedure Laterality Date  . BREAST LUMPECTOMY    . INGUINAL HERNIA REPAIR    . Right heel    . TUBAL LIGATION      Family History  Problem Relation Age of Onset  . Angina    . Stroke    . Hypertension    . Liver disease     Social History:  reports that she has never smoked. She has never used smokeless tobacco. She reports that she drinks alcohol. She reports that she does not use drugs.  Allergies:  Allergies  Allergen Reactions  . Meperidine Hcl     No prescriptions prior to admission.    No results found for this or any previous visit (from the past 48 hour(s)).  No results found.   Pertinent items are noted in HPI.  Height 5\' 5"  (1.651 m), weight 71.2 kg (157 lb).  General appearance: alert, cooperative and appears stated  age Head: Normocephalic, without obvious abnormality Neck: no JVD Resp: clear to auscultation bilaterally Cardio: regular rate and rhythm, S1, S2 normal, no murmur, click, rub or gallop GI: soft, non-tender; bowel sounds normal; no masses,  no organomegaly Extremities: mass right index finger Pulses: 2+ and symmetric Skin: Skin color, texture, turgor normal. No rashes or lesions Neurologic: Grossly normal Incision/Wound: na  Assessment/Plan Assessment:  1. Mucoid cyst of joint  2. Osteoarthritis of finger of right hand    Plan: She would like to have this removed. Pre-peri-and postoperative course are discussed along with risks and complications along with the etiology of mucoid cyst. She is aware that there is no guarantee to the surgery the possibility of infection recurrence injury to arteries nerves tendons complete relief symptoms dystrophy the possibility recurrence of the cyst possibility of stiffness in the joint. She would like to proceed she is scheduled for excision of mucoid cyst debridement distal interphalangeal joint right index finger as an outpatient under regional anesthesia.      Mikena Masoner R 03/25/2017, 8:14 AM

## 2017-03-25 NOTE — Op Note (Signed)
Dictation Number [id number]

## 2017-03-25 NOTE — Anesthesia Preprocedure Evaluation (Signed)
Anesthesia Evaluation  Patient identified by MRN, date of birth, ID band Patient awake    Reviewed: Allergy & Precautions, NPO status , Patient's Chart, lab work & pertinent test results  Airway Mallampati: II  TM Distance: >3 FB Neck ROM: Full    Dental no notable dental hx.    Pulmonary neg pulmonary ROS,    Pulmonary exam normal breath sounds clear to auscultation       Cardiovascular hypertension, negative cardio ROS Normal cardiovascular exam Rhythm:Regular Rate:Normal     Neuro/Psych negative neurological ROS  negative psych ROS   GI/Hepatic negative GI ROS, Neg liver ROS, GERD  ,  Endo/Other  negative endocrine ROS  Renal/GU negative Renal ROS  negative genitourinary   Musculoskeletal negative musculoskeletal ROS (+)   Abdominal   Peds negative pediatric ROS (+)  Hematology negative hematology ROS (+)   Anesthesia Other Findings   Reproductive/Obstetrics negative OB ROS                             Anesthesia Physical Anesthesia Plan  ASA: II  Anesthesia Plan: Bier Block   Post-op Pain Management:    Induction: Intravenous  Airway Management Planned: Nasal Cannula  Additional Equipment:   Intra-op Plan:   Post-operative Plan:   Informed Consent: I have reviewed the patients History and Physical, chart, labs and discussed the procedure including the risks, benefits and alternatives for the proposed anesthesia with the patient or authorized representative who has indicated his/her understanding and acceptance.   Dental advisory given  Plan Discussed with: CRNA  Anesthesia Plan Comments:         Anesthesia Quick Evaluation

## 2017-03-25 NOTE — Op Note (Signed)
NAME:  Catherine Holland, Catherine Holland NO.:  [id number]  MEDICAL RECORD NO.:  [id number]  LOCATION:                                 FACILITY:  PHYSICIAN:  Daryll Brod, M.D.            DATE OF BIRTH:  DATE OF PROCEDURE:  03/25/2017 DATE OF DISCHARGE:                              OPERATIVE REPORT   PREOPERATIVE DIAGNOSIS:  Mucoid tumor, degenerative arthritis, distal interphalangeal joint, right index finger.  POSTOPERATIVE DIAGNOSIS:  Mucoid tumor, degenerative arthritis, distal interphalangeal joint, right index finger.  OPERATION:  Excision of mucoid cyst; debridement of distal interphalangeal joint, right index finger.  SURGEON:  Daryll Brod, MD.  ASSISTANT:  None.  ANESTHESIA:  Forearm-based IV regional with IV sedation, metacarpal block.  PLACE OF SURGERY:  [facility].  HISTORY:  The patient is a 71 year old female with a history of a cyst over the distal interphalangeal joint of her index finger right hand. She is desirous having this removed.  X-rays revealed degenerative arthritis of the distal interphalangeal joint.  Pre, peri, and postoperative course have been discussed along with risks and complications.  She is aware that there is no guarantee to the surgery; the possibility of infection; recurrence of injury to arteries, nerves, tendons; incomplete relief of symptoms; and dystrophy.  In the preoperative area, the patient was seen, the extremity marked by both patient and surgeon.  Antibiotic given.  PROCEDURE IN DETAIL:  The patient was brought to the operating room, where a forearm-based IV regional anesthetic was carried out without difficulty under the direction of the Anesthesia Department.  She was prepped using ChloraPrep in a supine position with the right arm free. A 3-minute dry time was allowed, and a time-out taken, confirming patient and procedure.  A metacarpal block was then given with 0.25% bupivacaine without epinephrine  approximately 8 mL was used.  A curvilinear incision was made over the distal interphalangeal joint of the right index finger.  This was carried down through subcutaneous tissue.  Bleeders were electrocauterized with bipolar.  Beneath the skin, a tunnel was made out to the cyst, which was opened, debrided with a House curette, and hemostatic rongeur.  The joint was then opened at the distal interphalangeal joint on the radial aspect.  A synovectomy and debridement of osteophytes from the middle phalanx was then performed with the small rongeur.  A House curette was also used with a debridement of the distal interphalangeal joint.  The wound was copiously irrigated with saline.  Specimen was sent to Pathology.  The skin was closed with interrupted 4-0 nylon sutures.  A sterile compressive dressing splint to the finger was applied.  On deflation of the tourniquet, all fingers immediately pinked.  She was taken to the recovery room for observation in satisfactory condition. She will be discharged to home to return to the [place] in 1 week, on [facility].          ______________________________ Daryll Brod, M.D.     GK/MEDQ  D:  03/25/2017  T:  03/25/2017  Job:  [id number]

## 2017-03-25 NOTE — Anesthesia Postprocedure Evaluation (Signed)
Anesthesia Post Note  Patient: Catherine Holland  Procedure(s) Performed: Procedure(s) (LRB): EXCISION MUCOID CYST WITH DISTAL PHANGEAL JOINT ARTHROTOMY RIGHT INDEX FINGER (Right)  Patient location during evaluation: PACU Anesthesia Type: Bier Block Level of consciousness: awake and alert Pain management: pain level controlled Vital Signs Assessment: post-procedure vital signs reviewed and stable Respiratory status: spontaneous breathing, nonlabored ventilation and respiratory function stable Cardiovascular status: stable and blood pressure returned to baseline Anesthetic complications: no       Last Vitals:  Vitals:   03/25/17 1200 03/25/17 1215  BP: (!) 147/96 (!) 151/97  Pulse: 66 (!) 57  Resp: 17 17  Temp:      Last Pain:  Vitals:   03/25/17 1043  TempSrc: Oral                 Lynda Rainwater

## 2017-03-26 ENCOUNTER — Encounter (HOSPITAL_BASED_OUTPATIENT_CLINIC_OR_DEPARTMENT_OTHER): Payer: Self-pay | Admitting: Orthopedic Surgery

## 2017-06-04 ENCOUNTER — Ambulatory Visit
Admission: RE | Admit: 2017-06-04 | Discharge: 2017-06-04 | Disposition: A | Discharge status: Home / Self Care | Payer: PRIVATE HEALTH INSURANCE | Source: Ambulatory Visit | Attending: Family Medicine | Admitting: Family Medicine

## 2017-06-04 DIAGNOSIS — Z1231 Encounter for screening mammogram for malignant neoplasm of breast: Secondary | ICD-10-CM

## 2017-06-04 HISTORY — DX: Personal history of irradiation: Z92.3

## 2017-06-09 DIAGNOSIS — R1084 Generalized abdominal pain: Secondary | ICD-10-CM | POA: Diagnosis not present

## 2017-06-09 DIAGNOSIS — D259 Leiomyoma of uterus, unspecified: Secondary | ICD-10-CM | POA: Diagnosis not present

## 2017-06-09 DIAGNOSIS — Z01419 Encounter for gynecological examination (general) (routine) without abnormal findings: Secondary | ICD-10-CM | POA: Diagnosis not present

## 2017-06-09 DIAGNOSIS — Z6831 Body mass index (BMI) 31.0-31.9, adult: Secondary | ICD-10-CM | POA: Diagnosis not present

## 2017-06-11 DIAGNOSIS — E039 Hypothyroidism, unspecified: Secondary | ICD-10-CM | POA: Diagnosis not present

## 2017-06-11 DIAGNOSIS — R946 Abnormal results of thyroid function studies: Secondary | ICD-10-CM | POA: Diagnosis not present

## 2017-06-11 DIAGNOSIS — E782 Mixed hyperlipidemia: Secondary | ICD-10-CM | POA: Diagnosis not present

## 2017-06-11 DIAGNOSIS — E559 Vitamin D deficiency, unspecified: Secondary | ICD-10-CM | POA: Diagnosis not present

## 2017-06-17 DIAGNOSIS — E039 Hypothyroidism, unspecified: Secondary | ICD-10-CM | POA: Diagnosis not present

## 2017-06-17 DIAGNOSIS — Z0000 Encounter for general adult medical examination without abnormal findings: Secondary | ICD-10-CM | POA: Diagnosis not present

## 2017-06-17 DIAGNOSIS — E559 Vitamin D deficiency, unspecified: Secondary | ICD-10-CM | POA: Diagnosis not present

## 2017-06-17 DIAGNOSIS — I712 Thoracic aortic aneurysm, without rupture: Secondary | ICD-10-CM | POA: Diagnosis not present

## 2017-06-17 DIAGNOSIS — M85852 Other specified disorders of bone density and structure, left thigh: Secondary | ICD-10-CM | POA: Diagnosis not present

## 2017-06-17 DIAGNOSIS — Z1211 Encounter for screening for malignant neoplasm of colon: Secondary | ICD-10-CM | POA: Diagnosis not present

## 2017-06-17 DIAGNOSIS — E782 Mixed hyperlipidemia: Secondary | ICD-10-CM | POA: Diagnosis not present

## 2017-06-17 DIAGNOSIS — R1012 Left upper quadrant pain: Secondary | ICD-10-CM | POA: Diagnosis not present

## 2017-06-17 DIAGNOSIS — Z853 Personal history of malignant neoplasm of breast: Secondary | ICD-10-CM | POA: Diagnosis not present

## 2017-06-23 ENCOUNTER — Other Ambulatory Visit: Payer: Self-pay | Admitting: Family Medicine

## 2017-06-23 DIAGNOSIS — I712 Thoracic aortic aneurysm, without rupture, unspecified: Secondary | ICD-10-CM

## 2017-07-04 ENCOUNTER — Inpatient Hospital Stay
Admission: RE | Admit: 2017-07-04 | Discharge: 2017-07-04 | Disposition: A | Discharge status: Home / Self Care | Payer: PRIVATE HEALTH INSURANCE | Source: Ambulatory Visit | Attending: Family Medicine | Admitting: Family Medicine

## 2017-07-04 ENCOUNTER — Ambulatory Visit
Admission: RE | Admit: 2017-07-04 | Discharge: 2017-07-04 | Disposition: A | Discharge status: Home / Self Care | Payer: PRIVATE HEALTH INSURANCE | Source: Ambulatory Visit | Attending: Family Medicine | Admitting: Family Medicine

## 2017-07-04 DIAGNOSIS — I712 Thoracic aortic aneurysm, without rupture, unspecified: Secondary | ICD-10-CM

## 2017-07-04 MED ORDER — GADOBENATE DIMEGLUMINE 529 MG/ML IV SOLN
15.0000 mL | Freq: Once | INTRAVENOUS | Status: AC | PRN
  Administered 2017-07-04: 14 mL via INTRAVENOUS

## 2017-12-24 ENCOUNTER — Other Ambulatory Visit: Payer: Self-pay | Admitting: Family Medicine

## 2017-12-24 DIAGNOSIS — N644 Mastodynia: Secondary | ICD-10-CM | POA: Diagnosis not present

## 2017-12-30 ENCOUNTER — Ambulatory Visit
Admission: RE | Admit: 2017-12-30 | Discharge: 2017-12-30 | Disposition: A | Discharge status: Home / Self Care | Payer: PRIVATE HEALTH INSURANCE | Source: Ambulatory Visit | Attending: Family Medicine | Admitting: Family Medicine

## 2017-12-30 ENCOUNTER — Ambulatory Visit: Payer: PRIVATE HEALTH INSURANCE

## 2017-12-30 DIAGNOSIS — N644 Mastodynia: Secondary | ICD-10-CM

## 2018-05-06 ENCOUNTER — Other Ambulatory Visit: Payer: Self-pay | Admitting: Family Medicine

## 2018-05-06 DIAGNOSIS — Z1231 Encounter for screening mammogram for malignant neoplasm of breast: Secondary | ICD-10-CM

## 2018-05-11 DIAGNOSIS — M25551 Pain in right hip: Secondary | ICD-10-CM | POA: Diagnosis not present

## 2018-05-12 ENCOUNTER — Other Ambulatory Visit: Payer: Self-pay

## 2018-05-12 ENCOUNTER — Ambulatory Visit: Payer: PRIVATE HEALTH INSURANCE | Attending: Family Medicine | Admitting: Physical Therapy

## 2018-05-12 ENCOUNTER — Encounter: Payer: Self-pay | Admitting: Physical Therapy

## 2018-05-12 DIAGNOSIS — M6281 Muscle weakness (generalized): Secondary | ICD-10-CM | POA: Diagnosis not present

## 2018-05-12 DIAGNOSIS — M25551 Pain in right hip: Secondary | ICD-10-CM | POA: Diagnosis not present

## 2018-05-12 DIAGNOSIS — M25671 Stiffness of right ankle, not elsewhere classified: Secondary | ICD-10-CM | POA: Diagnosis not present

## 2018-05-12 NOTE — Therapy (Signed)
[facility] [address], [place], [place], [address] Phone: [phone]   Fax:  [phone]  Physical Therapy Evaluation  Patient Details  Name: Catherine Holland MRN: [id number] Date of Birth: 03-11-1946 Referring Provider: Cari Caraway, MD   Encounter Date: 05/12/2018  PT End of Session - 05/12/18 1527    Visit Number  1    Date for PT Re-Evaluation  07/07/18    Authorization Type  medicare    PT Start Time  1437    PT Stop Time  1528    PT Time Calculation (min)  51 min    Activity Tolerance  Patient tolerated treatment well    Behavior During Therapy  [facility] for tasks assessed/performed       Past Medical History:  Diagnosis Date  . Breast cancer ([place])    --s/p lumpectomy and XRT --Arimdex  . Chest pain, unspecified    --normal myoview in july 2010   . GERD (gastroesophageal reflux disease)   . Melanoma ([place])   . Personal history of radiation therapy 2007  . Tachycardia    -monitor 12/10. Sr with PACs --echo 12/10. EF normal. no signifcant valvular abnormalities. --ETT normal 4/11    . Thoracic aortic aneurysm [facility])     Past Surgical History:  Procedure Laterality Date  . BREAST BIOPSY Right 2007  . BREAST LUMPECTOMY Right 2007  . INGUINAL HERNIA REPAIR    . MASS EXCISION Right 03/25/2017   Procedure: EXCISION MUCOID CYST WITH DISTAL PHANGEAL JOINT ARTHROTOMY RIGHT INDEX FINGER;  Surgeon: Daryll Brod, MD;  Location: [facility];  Service: Orthopedics;  Laterality: Right;  REG/FAB  . Right heel    . TUBAL LIGATION      There were no vitals filed for this visit.   Subjective Assessment - 05/12/18 1441    Subjective  Pt reports pain started in February and it feels like when she had bursitis in the past.  States her right side has always been weaker due to a tendon lengthening in right achilles when she was younger.    Limitations  Walking;Sitting    How long can you sit comfortably?   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  depends on the day, but one day it was 8/10 walking around the block    Patient Stated Goals  no pain and be stronger    Currently in Pain?  Yes    Pain Score  8  not currently, wakes at night; 5-6; 8 is worst    Pain Orientation  Right    Pain Descriptors / Indicators  Dull    Pain Type  Acute pain;Chronic pain    Pain Radiating Towards  down side of the leg all the way to the lower leg    Pain Onset  More than a month ago    Pain Frequency  Intermittent    Aggravating Factors   lying on my side, sitting    Pain Relieving Factors  moving around     Effect of Pain on Daily Activities  walking and stairs, causing fatigue    Multiple Pain Sites  No         [facility] PT Assessment - 05/12/18 0001      Assessment   Medical Diagnosis  M25.551 (ICD-10-CM) - Pain in right hip    Referring Provider  Cari Caraway, MD    Onset Date/Surgical Date  --  Feb 2019; increased hip pain    Prior Therapy  No      Precautions   Precautions  None      Restrictions   Weight Bearing Restrictions  No      Balance Screen   Has the patient fallen in the past 6 months  No      [place] residence    Living Arrangements  Spouse/significant other      Prior Function   Level of Independence  Independent    Vocation  Full time employment    Vocation Requirements  desk work    Leisure  Print production planner; walking      Cognition   Overall Cognitive Status  Within Functional Limits for tasks assessed      Observation/Other Assessments   Observations  muscle atrophy of right LE calf, thigh, and glutes    Focus on Therapeutic Outcomes (FOTO)   45% limited      Posture/Postural Control   Posture/Postural Control  Postural limitations    Postural Limitations  Right pelvic obliquity      ROM / Strength   AROM / PROM / Strength  AROM;Strength;PROM      AROM   Overall AROM Comments   lumbar WFL;     AROM Assessment Site  Ankle    Right Ankle Dorsiflexion  0    Left Ankle Dorsiflexion  12      PROM   Overall PROM Comments  left hip ER/IR 75%      Strength   Strength Assessment Site  Hip;Knee;Ankle    Right/Left Hip  Right;Left    Right Hip Flexion  4/5    Right Hip Extension  4/5    Right Hip External Rotation   4+/5    Right Hip Internal Rotation  4-/5    Right Hip ABduction  4/5    Right Hip ADduction  4-/5    Left Hip Flexion  5/5    Left Hip Extension  5/5    Left Hip External Rotation  5/5    Left Hip Internal Rotation  5/5    Left Hip ABduction  5/5    Left Hip ADduction  5/5    Right/Left Knee  Right;Left    Right Knee Flexion  4+/5    Left Knee Flexion  5/5    Right Ankle Dorsiflexion  4-/5    Left Ankle Dorsiflexion  5/5      Flexibility   Soft Tissue Assessment /Muscle Length  yes    Hamstrings  right 80%; left 90%      Palpation   SI assessment   right anterior tilt in standing; neutral in supine    Palpation comment  tight and tender IT band, TFL on right side; right GT bursa tender, glute med attatchments tender      Ambulation/Gait   Gait Pattern  Decreased step length - left decreased dorsiflexion right LE                Objective measurements completed on examination: See above findings.      [facility] Adult PT Treatment/Exercise - 05/12/18 0001      Self-Care   Self-Care  Other Self-Care Comments    Other Self-Care Comments   educated and performed initial HEP             PT Education - 05/12/18 1547    Education Details   Access Code: [email or web address]  Person(s) Educated  Patient    Methods  Explanation;Demonstration;Handout    Comprehension  Verbalized understanding;Returned demonstration       PT Short Term Goals - 05/12/18 1530      PT SHORT TERM GOAL #1   Title  ind with initial HEP    Time  4    Period  Weeks    Status  New    Target Date  06/09/18      PT SHORT TERM GOAL #2   Title  AROM right  ankle increased to 5 degrees    Time  4    Period  Weeks    Status  New    Target Date  06/09/18      PT SHORT TERM GOAL #3   Title  hip pain decreased by 25% for improved ability to return to walking more regularly    Time  4    Period  Weeks    Status  New    Target Date  06/09/18        PT Long Term Goals - 05/12/18 1532      PT LONG TERM GOAL #1   Title  Pt will be ind with advanced HEP    Time  8    Period  Weeks    Status  New    Target Date  07/07/18      PT LONG TERM GOAL #2   Title  pt reports at least 75% less hip pain    Time  8    Period  Weeks    Status  New    Target Date  07/07/18      PT LONG TERM GOAL #3   Title  pt demonstrates 4+/5 right hip abduction, external rotation, and internal rotation for greater hip stability during transfers and walking.    Time  8    Period  Weeks    Status  New    Target Date  07/07/18      PT LONG TERM GOAL #4   Title  FOTO < or = to 36% limited    Time  8    Period  Weeks    Status  New    Target Date  07/07/18      PT LONG TERM GOAL #5   Title  right ankle DF at least 10 deg for improved ability to perform functinoal squats and gait mechanics    Time  8    Period  Weeks    Status  New    Target Date  07/07/18             Plan - 05/12/18 1547    Clinical Impression Statement  Pt presents to skilled PT due to worsening hip pain on right side.  Pt demonstrates weakness throughout Rt LE. Pt has decreaesd ROM.  She demonstrates posture and gait abnormalities with pelvic obliquity as stated above. Pt has tight and tender IT band, glutes, bursa, and attatchment of hip rotators.  Pt will benefit from skilled PT to address impairments and return to functional activities.    History and Personal Factors relevant to plan of care:  chronic hip pain; chronic weakness Rt LE    Clinical Presentation  Evolving    Clinical Presentation due to:  pt has had flare up of chronic pain in hip    Clinical Decision Making   Moderate    Rehab Potential  Excellent    Clinical Impairments Affecting Rehab Potential  chronic hip pain; chronic weakness Rt LE    PT Frequency  2x / week    PT Duration  8 weeks    PT Treatment/Interventions  ADLs/Self Care Home Management;Biofeedback;Cryotherapy;English as a second language teacher;Therapeutic activities;Therapeutic exercise;Neuromuscular re-education;Patient/family education;Scar mobilization;Manual techniques;Passive range of motion;Dry needling;Taping    PT Next Visit Plan  sTM to IT band, right glutes, achilles; glute med/glute max strength, hip rotation strength right LE, right ankle ROM, Rt LE strength    PT Home Exercise Plan   Access Code: [email or web address]     Recommended Other Services  eval 05/12/18    Consulted and Agree with Plan of Care  Patient       Patient will benefit from skilled therapeutic intervention in order to improve the following deficits and impairments:  Abnormal gait, Decreased activity tolerance, Difficulty walking, Impaired flexibility, Decreased strength, Decreased range of motion, Postural dysfunction, Increased muscle spasms, Pain, Increased fascial restricitons  Visit Diagnosis: Pain in right hip  Muscle weakness (generalized)  Stiffness of right ankle, not elsewhere classified     Problem List Patient Active Problem List   Diagnosis Date Noted  . Essential hypertension, benign 03/25/2011  . ANEURYSM, THORACIC AORTIC 01/08/2010  . PALPITATIONS 11/09/2009  . ABNORMAL EKG 11/09/2009  . TACHYCARDIA 11/07/2009  . CHEST PAIN-UNSPECIFIED 11/07/2009    Zannie Cove, PT 05/12/2018, 5:33 PM  [facility] [address], [place], [place], [address] Phone: [phone]   Fax:  [phone]  Name: Catherine Holland MRN: [id number] Date of Birth: [birth year]

## 2018-05-12 NOTE — Patient Instructions (Signed)
Access Code: [email or web address]  URL: [email or web address]  Date: 05/12/2018  Prepared by: Lovett Calender   Exercises  Sidelying Reverse Clamshell - 10 reps - 3 sets - 1x daily - 7x weekly  Supine Hamstring Stretch with Strap - 3 reps - 1 sets - 30 sec hold - 1x daily - 7x weekly  Supine Piriformis Stretch with Leg Straight - 3 reps - 1 sets - 30 sec hold - 1x daily - 7x weekly  Supine Piriformis Stretch - 3 reps - 1 sets - 30 sec hold - 1x daily - 7x weekly  [place] [address], [place], [place] [address] Phone # [phone] Fax [phone]

## 2018-05-15 ENCOUNTER — Encounter: Payer: PRIVATE HEALTH INSURANCE | Admitting: Physical Therapy

## 2018-05-18 ENCOUNTER — Encounter: Payer: PRIVATE HEALTH INSURANCE | Admitting: Physical Therapy

## 2018-05-19 ENCOUNTER — Ambulatory Visit: Payer: PRIVATE HEALTH INSURANCE | Attending: Family Medicine | Admitting: Physical Therapy

## 2018-05-19 DIAGNOSIS — M25551 Pain in right hip: Secondary | ICD-10-CM

## 2018-05-19 DIAGNOSIS — M25671 Stiffness of right ankle, not elsewhere classified: Secondary | ICD-10-CM | POA: Diagnosis not present

## 2018-05-19 DIAGNOSIS — M6281 Muscle weakness (generalized): Secondary | ICD-10-CM | POA: Diagnosis not present

## 2018-05-19 NOTE — Patient Instructions (Signed)
Access Code: [email or web address]  URL: [email or web address]  Date: 05/19/2018  Prepared by: Lovett Calender   Exercises  Gastroc Stretch on Wall - 3 reps - 1 sets - 30 sec hold - 1x daily - 7x weekly  Soleus Stretch on Wall - 3 reps - 1 sets - 30 sec hold - 1x daily - 7x weekly  Supine Hamstring Stretch with Strap - 3 reps - 1 sets - 30 sec hold - 1x daily - 7x weekly  Supine Piriformis Stretch with Leg Straight - 3 reps - 1 sets - 30 sec hold - 1x daily - 7x weekly  Supine Piriformis Stretch - 3 reps - 1 sets - 30 sec hold - 1x daily - 7x weekly  Sidelying Reverse Clamshell - 10 reps - 3 sets - 1x daily - 7x weekly  Clam - 10 reps - 3 sets - 1x daily - 7x weekly  Standing Hip Abduction with Anterior Support - 10 reps - 3 sets - 1x daily - 7x weekly  Long Sitting Ankle PNF D2 Dorsiflexion with Resistance - 10 reps - 3 sets - 1x daily - 7x weekly  [facility] [address] [place], [place] [address] Phone # [phone] Fax [phone]

## 2018-05-19 NOTE — Therapy (Signed)
[facility] [address], [place], [place], [address] Phone: [phone]   Fax:  [phone]  Physical Therapy Treatment  Patient Details  Name: Catherine Holland MRN: [id number] Date of Birth: [birth year] Referring Provider: Cari Caraway, MD   Encounter Date: 05/19/2018  PT End of Session - 05/19/18 1635    Visit Number  2    Date for PT Re-Evaluation  07/07/18    Authorization Type  medicare    PT Start Time  1620    PT Stop Time  1700    PT Time Calculation (min)  40 min    Activity Tolerance  Patient tolerated treatment well    Behavior During Therapy  [facility] for tasks assessed/performed       Past Medical History:  Diagnosis Date  . Breast cancer ([place])    --s/p lumpectomy and XRT --Arimdex  . Chest pain, unspecified    --normal myoview in july 2010   . GERD (gastroesophageal reflux disease)   . Melanoma ([place])   . Personal history of radiation therapy 2007  . Tachycardia    -monitor 12/10. Sr with PACs --echo 12/10. EF normal. no signifcant valvular abnormalities. --ETT normal 4/11    . Thoracic aortic aneurysm [facility])     Past Surgical History:  Procedure Laterality Date  . BREAST BIOPSY Right 2007  . BREAST LUMPECTOMY Right 2007  . INGUINAL HERNIA REPAIR    . MASS EXCISION Right 03/25/2017   Procedure: EXCISION MUCOID CYST WITH DISTAL PHANGEAL JOINT ARTHROTOMY RIGHT INDEX FINGER;  Surgeon: Daryll Brod, MD;  Location: [place];  Service: Orthopedics;  Laterality: Right;  REG/FAB  . Right heel    . TUBAL LIGATION      There were no vitals filed for this visit.  Subjective Assessment - 05/19/18 1631    Subjective  Pt denies pain currently, but had some intermittent that causes her to stand still due to severity.      Patient Stated Goals  no pain and be stronger    Currently in Pain?  No/denies                       [facility] Adult PT Treatment/Exercise - 05/19/18 0001       Neuro Re-ed    Neuro Re-ed Details   glute activation in standing for improved posture during exercises      Exercises   Exercises  Knee/Hip      Knee/Hip Exercises: Stretches   Hip Flexor Stretch  Right;1 rep;60 seconds    ITB Stretch  Right;2 reps;30 seconds    Gastroc Stretch  Right;Left;2 reps;30 seconds    Soleus Stretch  Right;60 seconds      Knee/Hip Exercises: Standing   Hip Abduction  Stengthening;Both;20 reps;Knee straight    Hip Extension  Stengthening;Both;20 reps;Knee straight      Knee/Hip Exercises: Supine   Other Supine Knee/Hip Exercises  ankle dorsiflexion and external rotation with red band - 20x      Knee/Hip Exercises: Sidelying   Clams  clam and reverse clam - 20x each RtLE needs cues to reduce pelvic rotation      Manual Therapy   Manual Therapy  Soft tissue mobilization    Manual therapy comments  left sidelying    Soft tissue mobilization  Right LE: IT, TFL, vastus lateralus, glutes             PT Education - 05/19/18 1643  Education Details  Access Code: [email or web address]     Person(s) Educated  Patient    Methods  Explanation;Demonstration;Handout    Comprehension  Verbalized understanding;Returned demonstration       PT Short Term Goals - 05/19/18 1643      PT SHORT TERM GOAL #1   Title  ind with initial HEP    Time  4    Period  Weeks    Status  On-going      PT SHORT TERM GOAL #2   Title  AROM right ankle increased to 5 degrees    Time  4    Period  Weeks    Status  On-going      PT SHORT TERM GOAL #3   Title  hip pain decreased by 25% for improved ability to return to walking more regularly    Time  4    Period  Weeks    Status  On-going        PT Long Term Goals - 05/12/18 1532      PT LONG TERM GOAL #1   Title  Pt will be ind with advanced HEP    Time  8    Period  Weeks    Status  New    Target Date  07/07/18      PT LONG TERM GOAL #2   Title  pt reports at least 75% less hip pain    Time  8    Period  Weeks     Status  New    Target Date  07/07/18      PT LONG TERM GOAL #3   Title  pt demonstrates 4+/5 right hip abduction, external rotation, and internal rotation for greater hip stability during transfers and walking.    Time  8    Period  Weeks    Status  New    Target Date  07/07/18      PT LONG TERM GOAL #4   Title  FOTO < or = to 36% limited    Time  8    Period  Weeks    Status  New    Target Date  07/07/18      PT LONG TERM GOAL #5   Title  right ankle DF at least 10 deg for improved ability to perform functinoal squats and gait mechanics    Time  8    Period  Weeks    Status  New    Target Date  07/07/18            Plan - 05/19/18 1726    Clinical Impression Statement  Pt did well with exercises and felt fatigued at the end of session.  Pt had muscle spasm of TFL.  She had difficulty with clam exercises.  She was able to do all exercises correctly with cues to contract Rt glutes.  Pt will benefit from skilled PT to porgress strength.    PT Treatment/Interventions  ADLs/Self Care Home Management;Biofeedback;Cryotherapy;English as a second language teacher;Therapeutic activities;Therapeutic exercise;Neuromuscular re-education;Patient/family education;Scar mobilization;Manual techniques;Passive range of motion;Dry needling;Taping    PT Next Visit Plan  right glutes, achilles; glute med/glute max strength, hip rotation strength right LE, right ankle ROM, Rt LE strength    PT Home Exercise Plan   Access Code: [email or web address]     Consulted and Agree with Plan of Care  Patient       Patient will benefit from skilled therapeutic intervention in order to improve the following deficits and  impairments:  Abnormal gait, Decreased activity tolerance, Difficulty walking, Impaired flexibility, Decreased strength, Decreased range of motion, Postural dysfunction, Increased muscle spasms, Pain, Increased fascial restricitons  Visit Diagnosis: Pain in right  hip  Muscle weakness (generalized)  Stiffness of right ankle, not elsewhere classified     Problem List Patient Active Problem List   Diagnosis Date Noted  . Essential hypertension, benign 03/25/2011  . ANEURYSM, THORACIC AORTIC 01/08/2010  . PALPITATIONS 11/09/2009  . ABNORMAL EKG 11/09/2009  . TACHYCARDIA 11/07/2009  . CHEST PAIN-UNSPECIFIED 11/07/2009    Wayne Both 05/19/2018, 5:30 PM   [facility] [address], [place], [place], [address] Phone: [phone]   Fax:  [phone]  Name: Catherine Holland MRN: [id number] Date of Birth: [birth year]

## 2018-05-26 ENCOUNTER — Ambulatory Visit: Payer: PRIVATE HEALTH INSURANCE | Admitting: Physical Therapy

## 2018-05-26 ENCOUNTER — Encounter: Payer: Self-pay | Admitting: Physical Therapy

## 2018-05-26 DIAGNOSIS — M25551 Pain in right hip: Secondary | ICD-10-CM | POA: Diagnosis not present

## 2018-05-26 DIAGNOSIS — M25671 Stiffness of right ankle, not elsewhere classified: Secondary | ICD-10-CM | POA: Diagnosis not present

## 2018-05-26 DIAGNOSIS — M6281 Muscle weakness (generalized): Secondary | ICD-10-CM

## 2018-05-26 NOTE — Therapy (Signed)
[facility] [address] [place], [place], [address] Phone: [phone]   Fax:  [phone]  Physical Therapy Treatment  Patient Details  Name: Catherine Holland MRN: [id number] Date of Birth: 02/28/1946 Referring Provider: Cari Caraway, MD   Encounter Date: 05/26/2018  PT End of Session - 05/26/18 1530    Visit Number  3    Date for PT Re-Evaluation  07/07/18    Authorization Type  medicare    PT Start Time  7209    PT Stop Time  1612    PT Time Calculation (min)  41 min    Activity Tolerance  Patient tolerated treatment well    Behavior During Therapy  [facility] for tasks assessed/performed       Past Medical History:  Diagnosis Date  . Breast cancer ([place])    --s/p lumpectomy and XRT --Arimdex  . Chest pain, unspecified    --normal myoview in july 2010   . GERD (gastroesophageal reflux disease)   . Melanoma ([place])   . Personal history of radiation therapy 2007  . Tachycardia    -monitor 12/10. Sr with PACs --echo 12/10. EF normal. no signifcant valvular abnormalities. --ETT normal 4/11    . Thoracic aortic aneurysm [facility])     Past Surgical History:  Procedure Laterality Date  . BREAST BIOPSY Right 2007  . BREAST LUMPECTOMY Right 2007  . INGUINAL HERNIA REPAIR    . MASS EXCISION Right 03/25/2017   Procedure: EXCISION MUCOID CYST WITH DISTAL PHANGEAL JOINT ARTHROTOMY RIGHT INDEX FINGER;  Surgeon: Daryll Brod, MD;  Location: [place];  Service: Orthopedics;  Laterality: Right;  REG/FAB  . Right heel    . TUBAL LIGATION      There were no vitals filed for this visit.  Subjective Assessment - 05/26/18 1539    Subjective  Pt states that it still hurts a lot at times, but not as often.  Pt went skiing 2 x since last visit.      Currently in Pain?  No/denies                       [facility] Adult PT Treatment/Exercise - 05/26/18 0001      Knee/Hip Exercises: Aerobic   Nustep  L2 x 5  min      Knee/Hip Exercises: Machines for Strengthening   Cybex Leg Press  single leg - Rt 30 lb x 30      Knee/Hip Exercises: Standing   Hip Abduction  Stengthening;Right;Left;5 reps;Knee straight    Abduction Limitations  standing on half foam roll dome side up      Knee/Hip Exercises: Supine   Short Arc Quad Sets  Strengthening;Right;20 reps;Limitations    Short Arc Quad Sets Limitations  2    Other Supine Knee/Hip Exercises  ankle dorsiflexion and eversion with red band - 20x; yellow band 20x      Manual Therapy   Manual Therapy  Soft tissue mobilization;Taping;Joint mobilization    Manual therapy comments  supine    Joint Mobilization  patella mobs    Soft tissue mobilization  Rt IT band    Kinesiotex  Facilitate Muscle      Kinesiotix   Facilitate Muscle   Y tape around patella to facilitate quad control               PT Short Term Goals - 05/26/18 1616      PT SHORT TERM GOAL #1  Title  ind with initial HEP    Status  Achieved        PT Long Term Goals - 05/12/18 1532      PT LONG TERM GOAL #1   Title  Pt will be ind with advanced HEP    Time  8    Period  Weeks    Status  New    Target Date  07/07/18      PT LONG TERM GOAL #2   Title  pt reports at least 75% less hip pain    Time  8    Period  Weeks    Status  New    Target Date  07/07/18      PT LONG TERM GOAL #3   Title  pt demonstrates 4+/5 right hip abduction, external rotation, and internal rotation for greater hip stability during transfers and walking.    Time  8    Period  Weeks    Status  New    Target Date  07/07/18      PT LONG TERM GOAL #4   Title  FOTO < or = to 36% limited    Time  8    Period  Weeks    Status  New    Target Date  07/07/18      PT LONG TERM GOAL #5   Title  right ankle DF at least 10 deg for improved ability to perform functinoal squats and gait mechanics    Time  8    Period  Weeks    Status  New    Target Date  07/07/18            Plan -  05/26/18 1612    Clinical Impression Statement  Pt responded well to manual for imporved quad activity.  Pt was fatigued with exercises focused on VMO, glute med, anterior tib, and right peroneals.  Pt will benefit from skilled PT to progress strengthening and improve muscle coordination, balance and strength.    PT Treatment/Interventions  ADLs/Self Care Home Management;Biofeedback;Cryotherapy;English as a second language teacher;Therapeutic activities;Therapeutic exercise;Neuromuscular re-education;Patient/family education;Scar mobilization;Manual techniques;Passive range of motion;Dry needling;Taping    PT Next Visit Plan  f/u on response to taping, right glutes, achilles; glute med/glute max strength, hip rotation strength right LE, right ankle ROM, Rt LE strength    PT Home Exercise Plan   Access Code: [email or web address]     Consulted and Agree with Plan of Care  Patient       Patient will benefit from skilled therapeutic intervention in order to improve the following deficits and impairments:  Abnormal gait, Decreased activity tolerance, Difficulty walking, Impaired flexibility, Decreased strength, Decreased range of motion, Postural dysfunction, Increased muscle spasms, Pain, Increased fascial restricitons  Visit Diagnosis: Pain in right hip  Muscle weakness (generalized)  Stiffness of right ankle, not elsewhere classified     Problem List Patient Active Problem List   Diagnosis Date Noted  . Essential hypertension, benign 03/25/2011  . ANEURYSM, THORACIC AORTIC 01/08/2010  . PALPITATIONS 11/09/2009  . ABNORMAL EKG 11/09/2009  . TACHYCARDIA 11/07/2009  . CHEST PAIN-UNSPECIFIED 11/07/2009    Zannie Cove, PT 05/26/2018, 4:17 PM  [facility] [address] [place], [place], [address] Phone: [phone]   Fax:  [phone]  Name: Catherine Holland MRN: [id number] Date of Birth:  1946/04/20

## 2018-05-28 ENCOUNTER — Ambulatory Visit: Payer: PRIVATE HEALTH INSURANCE | Admitting: Physical Therapy

## 2018-05-28 ENCOUNTER — Encounter: Payer: Self-pay | Admitting: Physical Therapy

## 2018-05-28 DIAGNOSIS — M6281 Muscle weakness (generalized): Secondary | ICD-10-CM | POA: Diagnosis not present

## 2018-05-28 DIAGNOSIS — M25671 Stiffness of right ankle, not elsewhere classified: Secondary | ICD-10-CM | POA: Diagnosis not present

## 2018-05-28 DIAGNOSIS — M25551 Pain in right hip: Secondary | ICD-10-CM

## 2018-05-28 NOTE — Therapy (Signed)
[facility] [address], [place], [place], [address] Phone: [phone]   Fax:  [phone]  Physical Therapy Treatment  Patient Details  Name: Catherine Holland MRN: [id number] Date of Birth: [birth year] Referring Provider: Cari Caraway, MD   Encounter Date: 05/28/2018  PT End of Session - 05/28/18 1620    Visit Number  4    Date for PT Re-Evaluation  07/07/18    Authorization Type  medicare    PT Start Time  1620    PT Stop Time  1700    PT Time Calculation (min)  40 min    Activity Tolerance  Patient tolerated treatment well    Behavior During Therapy  [facility] for tasks assessed/performed       Past Medical History:  Diagnosis Date  . Breast cancer ([place])    --s/p lumpectomy and XRT --Arimdex  . Chest pain, unspecified    --normal myoview in july 2010   . GERD (gastroesophageal reflux disease)   . Melanoma ([place])   . Personal history of radiation therapy 2007  . Tachycardia    -monitor 12/10. Sr with PACs --echo 12/10. EF normal. no signifcant valvular abnormalities. --ETT normal 4/11    . Thoracic aortic aneurysm [facility])     Past Surgical History:  Procedure Laterality Date  . BREAST BIOPSY Right 2007  . BREAST LUMPECTOMY Right 2007  . INGUINAL HERNIA REPAIR    . MASS EXCISION Right 03/25/2017   Procedure: EXCISION MUCOID CYST WITH DISTAL PHANGEAL JOINT ARTHROTOMY RIGHT INDEX FINGER;  Surgeon: Daryll Brod, MD;  Location: [place];  Service: Orthopedics;  Laterality: Right;  REG/FAB  . Right heel    . TUBAL LIGATION      There were no vitals filed for this visit.  Subjective Assessment - 05/28/18 1624    Subjective  Pt reports a little sore today because she didn't take medication for arthritis.  Pt denies soreness after exercise    Currently in Pain?  Yes    Pain Score  6     Pain Location  Hip    Pain Orientation  Right    Pain Descriptors / Indicators  Dull    Pain Type  Acute  pain;Chronic pain    Pain Onset  More than a month ago    Pain Frequency  Intermittent    Aggravating Factors   sitting    Pain Relieving Factors  sitting in the car                       [facility] Adult PT Treatment/Exercise - 05/28/18 0001      Self-Care   Other Self-Care Comments   self massage with foam roller and small ball on glute med; sitting posture      Knee/Hip Exercises: Stretches   Piriformis Stretch  Right;2 reps;20 seconds    Gastroc Stretch  Right;Left;2 reps;30 seconds      Knee/Hip Exercises: Aerobic   Nustep  L2 x 5 min      Knee/Hip Exercises: Standing   Hip Abduction  Stengthening;Right;Left;5 reps;Knee straight    Abduction Limitations  standing on half foam roll dome side up      Knee/Hip Exercises: Seated   Long Arc Quad  Strengthening;Right;15 reps;Weights    Long Arc Quad Weight  2 lbs.    Marching  Strengthening;Right;Left;20 reps;Weights    Marching Limitations  2      Manual Therapy   Soft  tissue mobilization  Right LE: IT, TFL, vastus lateralus, glutes               PT Short Term Goals - 05/26/18 1616      PT SHORT TERM GOAL #1   Title  ind with initial HEP    Status  Achieved        PT Long Term Goals - 05/12/18 1532      PT LONG TERM GOAL #1   Title  Pt will be ind with advanced HEP    Time  8    Period  Weeks    Status  New    Target Date  07/07/18      PT LONG TERM GOAL #2   Title  pt reports at least 75% less hip pain    Time  8    Period  Weeks    Status  New    Target Date  07/07/18      PT LONG TERM GOAL #3   Title  pt demonstrates 4+/5 right hip abduction, external rotation, and internal rotation for greater hip stability during transfers and walking.    Time  8    Period  Weeks    Status  New    Target Date  07/07/18      PT LONG TERM GOAL #4   Title  FOTO < or = to 36% limited    Time  8    Period  Weeks    Status  New    Target Date  07/07/18      PT LONG TERM GOAL #5   Title  right  ankle DF at least 10 deg for improved ability to perform functinoal squats and gait mechanics    Time  8    Period  Weeks    Status  New    Target Date  07/07/18            Plan - 05/28/18 1707    Clinical Impression Statement  Pt has more pain today that she feels is from not taking her medication.  Pt responded well to exercises but with increased muscle fatigue of glutes and increased muscle tightness.  Pt performed self massage TE and reported feeling better.  Pt was educated on sitting posture for reduced hip flexion.      PT Treatment/Interventions  ADLs/Self Care Home Management;Biofeedback;Cryotherapy;English as a second language teacher;Therapeutic activities;Therapeutic exercise;Neuromuscular re-education;Patient/family education;Scar mobilization;Manual techniques;Passive range of motion;Dry needling;Taping    PT Next Visit Plan  Right LE strengthening, f/u on seat height at work    PT Home Exercise Plan   Access Code: [email or web address]     Consulted and Agree with Plan of Care  Patient       Patient will benefit from skilled therapeutic intervention in order to improve the following deficits and impairments:  Abnormal gait, Decreased activity tolerance, Difficulty walking, Impaired flexibility, Decreased strength, Decreased range of motion, Postural dysfunction, Increased muscle spasms, Pain, Increased fascial restricitons  Visit Diagnosis: Pain in right hip  Muscle weakness (generalized)  Stiffness of right ankle, not elsewhere classified     Problem List Patient Active Problem List   Diagnosis Date Noted  . Essential hypertension, benign 03/25/2011  . ANEURYSM, THORACIC AORTIC 01/08/2010  . PALPITATIONS 11/09/2009  . ABNORMAL EKG 11/09/2009  . TACHYCARDIA 11/07/2009  . CHEST PAIN-UNSPECIFIED 11/07/2009    Zannie Cove, PT 05/28/2018, 5:16 PM  [facility] [address],  [address] [place],  [place], [address] Phone: [phone]   Fax:  [phone]  Name: Lexandra Rettke MRN: [id number] Date of Birth: 04-Feb-1946

## 2018-06-02 ENCOUNTER — Ambulatory Visit: Payer: PRIVATE HEALTH INSURANCE | Admitting: Physical Therapy

## 2018-06-02 DIAGNOSIS — M25671 Stiffness of right ankle, not elsewhere classified: Secondary | ICD-10-CM | POA: Diagnosis not present

## 2018-06-02 DIAGNOSIS — M25551 Pain in right hip: Secondary | ICD-10-CM | POA: Diagnosis not present

## 2018-06-02 DIAGNOSIS — M6281 Muscle weakness (generalized): Secondary | ICD-10-CM

## 2018-06-02 NOTE — Patient Instructions (Signed)
Access Code: [email or web address]  URL: [email or web address]  Date: 06/02/2018  Prepared by: Lovett Calender   Exercises  Gastroc Stretch on Wall - 3 reps - 1 sets - 30 sec hold - 1x daily - 7x weekly  Soleus Stretch on Wall - 3 reps - 1 sets - 30 sec hold - 1x daily - 7x weekly  Supine Hamstring Stretch with Strap - 3 reps - 1 sets - 30 sec hold - 1x daily - 7x weekly  Supine Piriformis Stretch with Leg Straight - 3 reps - 1 sets - 30 sec hold - 1x daily - 7x weekly  Supine Piriformis Stretch - 3 reps - 1 sets - 30 sec hold - 1x daily - 7x weekly  Sidelying Reverse Clamshell - 10 reps - 3 sets - 1x daily - 7x weekly  Clam - 10 reps - 3 sets - 1x daily - 7x weekly  Long Sitting Ankle PNF D2 Dorsiflexion with Resistance - 10 reps - 3 sets - 1x daily - 7x weekly  Side Stepping with Resistance at Thighs - 10 reps - 3 sets - 1x daily - 7x weekly  Supine Hip Internal and External Rotation - 3 reps - 1 sets - 30 sec hold - 1x daily - 7x weekly  Seated Ankle Eversion with Resistance - 10 reps - 3 sets - 1x daily - 7x weekly   [facility] [address] [place], [place] [address] Phone # [phone] Fax [phone]

## 2018-06-02 NOTE — Therapy (Signed)
[facility] [address], [place], [place], [address] Phone: [phone]   Fax:  [phone]  Physical Therapy Treatment  Patient Details  Name: Catherine Holland MRN: [id number] Date of Birth: [birth year] Referring Provider: Cari Caraway, MD   Encounter Date: 06/02/2018  PT End of Session - 06/02/18 1623    Visit Number  5    Date for PT Re-Evaluation  07/07/18    Authorization Type  medicare    PT Start Time  2409    PT Stop Time  1659    PT Time Calculation (min)  42 min    Activity Tolerance  Patient tolerated treatment well    Behavior During Therapy  [facility] for tasks assessed/performed       Past Medical History:  Diagnosis Date  . Breast cancer ([place])    --s/p lumpectomy and XRT --Arimdex  . Chest pain, unspecified    --normal myoview in july 2010   . GERD (gastroesophageal reflux disease)   . Melanoma ([place])   . Personal history of radiation therapy 2007  . Tachycardia    -monitor 12/10. Sr with PACs --echo 12/10. EF normal. no signifcant valvular abnormalities. --ETT normal 4/11    . Thoracic aortic aneurysm [facility])     Past Surgical History:  Procedure Laterality Date  . BREAST BIOPSY Right 2007  . BREAST LUMPECTOMY Right 2007  . INGUINAL HERNIA REPAIR    . MASS EXCISION Right 03/25/2017   Procedure: EXCISION MUCOID CYST WITH DISTAL PHANGEAL JOINT ARTHROTOMY RIGHT INDEX FINGER;  Surgeon: Daryll Brod, MD;  Location: [place];  Service: Orthopedics;  Laterality: Right;  REG/FAB  . Right heel    . TUBAL LIGATION      There were no vitals filed for this visit.  Subjective Assessment - 06/02/18 1621    Subjective  Pt states she is good today but yesterday she did.  Not sure if the seat adjustments at work have helped yet because it has only been 2 days.    Currently in Pain?  Yes    Pain Score  1     Pain Location  Hip    Pain Orientation  Right    Pain Descriptors / Indicators  Dull     Pain Type  Acute pain;Chronic pain    Pain Onset  More than a month ago    Pain Frequency  Intermittent    Aggravating Factors   up and down the stairs just a little one time    Multiple Pain Sites  No         [facility] PT Assessment - 06/02/18 0001      AROM   Right Ankle Dorsiflexion  4      Strength   Right Hip ABduction  4/5                   [facility] Adult PT Treatment/Exercise - 06/02/18 0001      Knee/Hip Exercises: Aerobic   Nustep  L1 x 5 min      Knee/Hip Exercises: Standing   Lateral Step Up  20 reps;Hand Hold: 0;Step Height: 4" cues for slow lowering and knee alignment    Other Standing Knee Exercises  side stepping red band around knees      Knee/Hip Exercises: Supine   Other Supine Knee/Hip Exercises  ER hip with red band around ankles  - 20x      Knee/Hip Exercises: Sidelying   Hip ABduction  Strengthening;Right;20 reps    Clams  clam and reverse clam - 20x each RtLE red band and 2lb ankle weight      Ankle Exercises: Seated   BAPS  Level 2;15 reps             PT Education - 06/02/18 1717    Education Details   Access Code: [email or web address]     Person(s) Educated  Patient    Methods  Explanation;Demonstration;Handout    Comprehension  Verbalized understanding;Returned demonstration       PT Short Term Goals - 05/26/18 1616      PT SHORT TERM GOAL #1   Title  ind with initial HEP    Status  Achieved        PT Long Term Goals - 06/02/18 1639      PT LONG TERM GOAL #1   Title  Pt will be ind with advanced HEP    Status  On-going      PT LONG TERM GOAL #2   Title  pt reports at least 75% less hip pain    Baseline  there's a good bit less, maybe 50% less    Status  On-going      PT LONG TERM GOAL #3   Title  pt demonstrates 4+/5 right hip abduction, external rotation, and internal rotation for greater hip stability during transfers and walking.    Baseline  4/5 abduction    Status  On-going      PT LONG TERM GOAL #4   Title   FOTO < or = to 36% limited    Status  On-going      PT LONG TERM GOAL #5   Title  right ankle DF at least 10 deg for improved ability to perform functinoal squats and gait mechanics    Baseline  4 deg AROM    Status  On-going            Plan - 06/02/18 1706    Clinical Impression Statement  Pt was doing well today and reports overall feeling 50% improved.  She was able to advance exercise difficulty with step ups and adding resistance to internal and external hip rotation.  Pt will benefit from continue with POC to achieve functional goals.    PT Treatment/Interventions  ADLs/Self Care Home Management;Biofeedback;Cryotherapy;English as a second language teacher;Therapeutic activities;Therapeutic exercise;Neuromuscular re-education;Patient/family education;Scar mobilization;Manual techniques;Passive range of motion;Dry needling;Taping    PT Next Visit Plan  Right LE strengthening, hip abduction and internal/external rotation     PT Home Exercise Plan   Access Code: [email or web address]     Recommended Other Services  eval 6/25, second attempt sent    Consulted and Agree with Plan of Care  Patient       Patient will benefit from skilled therapeutic intervention in order to improve the following deficits and impairments:  Abnormal gait, Decreased activity tolerance, Difficulty walking, Impaired flexibility, Decreased strength, Decreased range of motion, Postural dysfunction, Increased muscle spasms, Pain, Increased fascial restricitons  Visit Diagnosis: Pain in right hip  Muscle weakness (generalized)  Stiffness of right ankle, not elsewhere classified     Problem List Patient Active Problem List   Diagnosis Date Noted  . Essential hypertension, benign 03/25/2011  . ANEURYSM, THORACIC AORTIC 01/08/2010  . PALPITATIONS 11/09/2009  . ABNORMAL EKG 11/09/2009  . TACHYCARDIA 11/07/2009  . CHEST PAIN-UNSPECIFIED 11/07/2009    Zannie Cove,  PT 06/02/2018, 5:20 PM  [facility] [address], [address]  [place], [place], [address] Phone: [phone]   Fax:  [phone]  Name: Daizha Anand MRN: [id number] Date of Birth: 27-Oct-1946

## 2018-06-04 ENCOUNTER — Ambulatory Visit: Payer: PRIVATE HEALTH INSURANCE | Admitting: Physical Therapy

## 2018-06-04 DIAGNOSIS — M25671 Stiffness of right ankle, not elsewhere classified: Secondary | ICD-10-CM | POA: Diagnosis not present

## 2018-06-04 DIAGNOSIS — M6281 Muscle weakness (generalized): Secondary | ICD-10-CM

## 2018-06-04 DIAGNOSIS — M25551 Pain in right hip: Secondary | ICD-10-CM | POA: Diagnosis not present

## 2018-06-04 NOTE — Therapy (Signed)
[facility] [address], [place], [place], [address] Phone: [phone]   Fax:  [phone]  Physical Therapy Treatment  Patient Details  Name: Catherine Holland MRN: [id number] Date of Birth: [birth year] Referring Provider: Cari Caraway, MD   Encounter Date: 06/04/2018  PT End of Session - 06/04/18 1657    Visit Number  6    Date for PT Re-Evaluation  07/07/18    Authorization Type  medicare    PT Start Time  1612    PT Stop Time  1655    PT Time Calculation (min)  43 min    Activity Tolerance  Patient tolerated treatment well    Behavior During Therapy  [facility] for tasks assessed/performed       Past Medical History:  Diagnosis Date  . Breast cancer ([place])    --s/p lumpectomy and XRT --Arimdex  . Chest pain, unspecified    --normal myoview in july 2010   . GERD (gastroesophageal reflux disease)   . Melanoma ([place])   . Personal history of radiation therapy 2007  . Tachycardia    -monitor 12/10. Sr with PACs --echo 12/10. EF normal. no signifcant valvular abnormalities. --ETT normal 4/11    . Thoracic aortic aneurysm [facility])     Past Surgical History:  Procedure Laterality Date  . BREAST BIOPSY Right 2007  . BREAST LUMPECTOMY Right 2007  . INGUINAL HERNIA REPAIR    . MASS EXCISION Right 03/25/2017   Procedure: EXCISION MUCOID CYST WITH DISTAL PHANGEAL JOINT ARTHROTOMY RIGHT INDEX FINGER;  Surgeon: Daryll Brod, MD;  Location: [place];  Service: Orthopedics;  Laterality: Right;  REG/FAB  . Right heel    . TUBAL LIGATION      There were no vitals filed for this visit.  Subjective Assessment - 06/04/18 1615    Subjective  Pt has been feeling good, but today was feeling pain in the back and in the front.  I went for a walk with the dog and didn't have pain.  Denies pain currently.    Currently in Pain?  No/denies                       [facility] Adult PT Treatment/Exercise - 06/04/18  0001      Knee/Hip Exercises: Stretches   Active Hamstring Stretch  Right;Left;3 reps;20 seconds    ITB Stretch  Right;2 reps;30 seconds    Piriformis Stretch  Right;2 reps;20 seconds      Knee/Hip Exercises: Aerobic   Nustep  L1 x 4 min      Knee/Hip Exercises: Standing   Lateral Step Up  20 reps;Step Height: 4";Hand Hold: 1 cues for slow lowering and knee alignment    SLS  on black foam mat with forward and side kicks    Walking with Sports Cord  fwd and back - sports cord - 25# - 5x each way      Knee/Hip Exercises: Sidelying   Clams  clam and reverse clam - 20x each RtLE red band and 2lb ankle weight               PT Short Term Goals - 05/26/18 1616      PT SHORT TERM GOAL #1   Title  ind with initial HEP    Status  Achieved        PT Long Term Goals - 06/02/18 1639      PT LONG TERM GOAL #1  Title  Pt will be ind with advanced HEP    Status  On-going      PT LONG TERM GOAL #2   Title  pt reports at least 75% less hip pain    Baseline  there's a good bit less, maybe 50% less    Status  On-going      PT LONG TERM GOAL #3   Title  pt demonstrates 4+/5 right hip abduction, external rotation, and internal rotation for greater hip stability during transfers and walking.    Baseline  4/5 abduction    Status  On-going      PT LONG TERM GOAL #4   Title  FOTO < or = to 36% limited    Status  On-going      PT LONG TERM GOAL #5   Title  right ankle DF at least 10 deg for improved ability to perform functinoal squats and gait mechanics    Baseline  4 deg AROM    Status  On-going            Plan - 06/04/18 1700    Clinical Impression Statement  Patient is doing well.  She reports overall less pain and was able to walk her dog without pain.  Pt continues to demonstrate increased level of difficulty with exercises during treatment. Recommended to continue plan of care.    PT Treatment/Interventions  ADLs/Self Care Home  Management;Biofeedback;Cryotherapy;English as a second language teacher;Therapeutic activities;Therapeutic exercise;Neuromuscular re-education;Patient/family education;Scar mobilization;Manual techniques;Passive range of motion;Dry needling;Taping    PT Next Visit Plan  Right LE strengthening, hip abduction and internal/external rotation     Consulted and Agree with Plan of Care  Patient       Patient will benefit from skilled therapeutic intervention in order to improve the following deficits and impairments:  Abnormal gait, Decreased activity tolerance, Difficulty walking, Impaired flexibility, Decreased strength, Decreased range of motion, Postural dysfunction, Increased muscle spasms, Pain, Increased fascial restricitons  Visit Diagnosis: Pain in right hip  Muscle weakness (generalized)  Stiffness of right ankle, not elsewhere classified     Problem List Patient Active Problem List   Diagnosis Date Noted  . Essential hypertension, benign 03/25/2011  . ANEURYSM, THORACIC AORTIC 01/08/2010  . PALPITATIONS 11/09/2009  . ABNORMAL EKG 11/09/2009  . TACHYCARDIA 11/07/2009  . CHEST PAIN-UNSPECIFIED 11/07/2009    Zannie Cove, PT 06/04/2018, 5:03 PM  [facility] [address], [place], [place], [address] Phone: [phone]   Fax:  [phone]  Name: Catherine Holland MRN: [id number] Date of Birth: May 16, 1946

## 2018-06-08 ENCOUNTER — Ambulatory Visit
Admission: RE | Admit: 2018-06-08 | Discharge: 2018-06-08 | Disposition: A | Discharge status: Home / Self Care | Payer: PRIVATE HEALTH INSURANCE | Source: Ambulatory Visit | Attending: Family Medicine | Admitting: Family Medicine

## 2018-06-08 DIAGNOSIS — Z1231 Encounter for screening mammogram for malignant neoplasm of breast: Secondary | ICD-10-CM

## 2018-06-09 ENCOUNTER — Ambulatory Visit: Payer: PRIVATE HEALTH INSURANCE | Admitting: Physical Therapy

## 2018-06-09 ENCOUNTER — Encounter: Payer: Self-pay | Admitting: Physical Therapy

## 2018-06-09 DIAGNOSIS — M6281 Muscle weakness (generalized): Secondary | ICD-10-CM | POA: Diagnosis not present

## 2018-06-09 DIAGNOSIS — M25551 Pain in right hip: Secondary | ICD-10-CM

## 2018-06-09 DIAGNOSIS — M25671 Stiffness of right ankle, not elsewhere classified: Secondary | ICD-10-CM

## 2018-06-09 NOTE — Therapy (Signed)
[facility] [address], [place], [place], [address] Phone: [phone]   Fax:  [phone]  Physical Therapy Treatment  Patient Details  Name: Catherine Holland MRN: [id number] Date of Birth: September 17, 1946 Referring Provider: Cari Caraway, MD   Encounter Date: 06/09/2018  PT End of Session - 06/09/18 1656    Visit Number  7    Date for PT Re-Evaluation  07/07/18    Authorization Type  medicare    PT Start Time  1618    PT Stop Time  1659    PT Time Calculation (min)  41 min    Activity Tolerance  Patient tolerated treatment well    Behavior During Therapy  [facility] for tasks assessed/performed       Past Medical History:  Diagnosis Date  . Breast cancer ([place])    --s/p lumpectomy and XRT --Arimdex  . Chest pain, unspecified    --normal myoview in july 2010   . GERD (gastroesophageal reflux disease)   . Melanoma ([place])   . Personal history of radiation therapy 2007  . Tachycardia    -monitor 12/10. Sr with PACs --echo 12/10. EF normal. no signifcant valvular abnormalities. --ETT normal 4/11    . Thoracic aortic aneurysm [facility])     Past Surgical History:  Procedure Laterality Date  . BREAST BIOPSY Right 2007  . BREAST LUMPECTOMY Right 2007  . INGUINAL HERNIA REPAIR    . MASS EXCISION Right 03/25/2017   Procedure: EXCISION MUCOID CYST WITH DISTAL PHANGEAL JOINT ARTHROTOMY RIGHT INDEX FINGER;  Surgeon: Daryll Brod, MD;  Location: [place];  Service: Orthopedics;  Laterality: Right;  REG/FAB  . Right heel    . TUBAL LIGATION      There were no vitals filed for this visit.  Subjective Assessment - 06/09/18 1623    Subjective  Pt states she is feeling better, but not perfect.      How long can you sit comfortably?  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  depends on the day, but one day it was 8/10 walking around the block    Patient Stated Goals  no  pain and be stronger    Currently in Pain?  No/denies                       [facility] Adult PT Treatment/Exercise - 06/09/18 0001      Knee/Hip Exercises: Stretches   Active Hamstring Stretch  Right;Left;3 reps;20 seconds    Piriformis Stretch  Right;2 reps;20 seconds    Gastroc Stretch  Right;Left;2 reps;30 seconds    Soleus Stretch  Right;60 seconds      Knee/Hip Exercises: Aerobic   Nustep  L1 x 6 min      Knee/Hip Exercises: Standing   Lateral Step Up  20 reps;Step Height: 4";Hand Hold: 1 cues for slow lowering and knee alignment    SLS with Vectors  3 ways on foam mat    Walking with Sports Cord  fwd and back - sports cord - 25# - 5x each way ; side to side 5x 20#      Knee/Hip Exercises: Supine   Bridges  Strengthening;Both;2 sets;10 reps red band 5 sec hold    Straight Leg Raises  Strengthening;Right;15 reps      Knee/Hip Exercises: Sidelying   Hip ABduction  Strengthening;Right;20 reps 1.5#    Hip ADduction  Strengthening;Right;15 reps  1.5#      Knee/Hip Exercises: Prone   Straight Leg Raises  Strengthening;Right;15 reps 1.5 #               PT Short Term Goals - 05/26/18 1616      PT SHORT TERM GOAL #1   Title  ind with initial HEP    Status  Achieved        PT Long Term Goals - 06/02/18 1639      PT LONG TERM GOAL #1   Title  Pt will be ind with advanced HEP    Status  On-going      PT LONG TERM GOAL #2   Title  pt reports at least 75% less hip pain    Baseline  there's a good bit less, maybe 50% less    Status  On-going      PT LONG TERM GOAL #3   Title  pt demonstrates 4+/5 right hip abduction, external rotation, and internal rotation for greater hip stability during transfers and walking.    Baseline  4/5 abduction    Status  On-going      PT LONG TERM GOAL #4   Title  FOTO < or = to 36% limited    Status  On-going      PT LONG TERM GOAL #5   Title  right ankle DF at least 10 deg for improved ability to perform functinoal  squats and gait mechanics    Baseline  4 deg AROM    Status  On-going            Plan - 06/09/18 1658    Clinical Impression Statement  Patient did well with exercises.  She became fatigued with side stepping and glute med fatigues quickly.  Pt will continue with POC for hip and LE strengthening and ROM.    PT Treatment/Interventions  ADLs/Self Care Home Management;Biofeedback;Cryotherapy;English as a second language teacher;Therapeutic activities;Therapeutic exercise;Neuromuscular re-education;Patient/family education;Scar mobilization;Manual techniques;Passive range of motion;Dry needling;Taping    PT Next Visit Plan  Right LE strengthening, hip abduction and internal/external rotation     Consulted and Agree with Plan of Care  Patient       Patient will benefit from skilled therapeutic intervention in order to improve the following deficits and impairments:  Abnormal gait, Decreased activity tolerance, Difficulty walking, Impaired flexibility, Decreased strength, Decreased range of motion, Postural dysfunction, Increased muscle spasms, Pain, Increased fascial restricitons  Visit Diagnosis: Pain in right hip  Muscle weakness (generalized)  Stiffness of right ankle, not elsewhere classified     Problem List Patient Active Problem List   Diagnosis Date Noted  . Essential hypertension, benign 03/25/2011  . ANEURYSM, THORACIC AORTIC 01/08/2010  . PALPITATIONS 11/09/2009  . ABNORMAL EKG 11/09/2009  . TACHYCARDIA 11/07/2009  . CHEST PAIN-UNSPECIFIED 11/07/2009    Zannie Cove, PT 06/09/2018, 5:02 PM  [facility] [address], [place], [place], [address] Phone: [phone]   Fax:  [phone]  Name: Catherine Holland MRN: [id number] Date of Birth: May 23, 1946

## 2018-06-10 DIAGNOSIS — Z6827 Body mass index (BMI) 27.0-27.9, adult: Secondary | ICD-10-CM | POA: Diagnosis not present

## 2018-06-10 DIAGNOSIS — Z01419 Encounter for gynecological examination (general) (routine) without abnormal findings: Secondary | ICD-10-CM | POA: Diagnosis not present

## 2018-06-16 ENCOUNTER — Encounter: Payer: Self-pay | Admitting: Physical Therapy

## 2018-06-16 ENCOUNTER — Ambulatory Visit: Payer: PRIVATE HEALTH INSURANCE | Admitting: Physical Therapy

## 2018-06-16 DIAGNOSIS — M6281 Muscle weakness (generalized): Secondary | ICD-10-CM | POA: Diagnosis not present

## 2018-06-16 DIAGNOSIS — M25671 Stiffness of right ankle, not elsewhere classified: Secondary | ICD-10-CM | POA: Diagnosis not present

## 2018-06-16 DIAGNOSIS — M25551 Pain in right hip: Secondary | ICD-10-CM | POA: Diagnosis not present

## 2018-06-16 NOTE — Therapy (Signed)
[facility] [address] [place], [place], [address] Phone: [phone]   Fax:  [phone]  Physical Therapy Treatment  Patient Details  Name: Catherine Holland MRN: [id number] Date of Birth: Feb 10, 1946 Referring Provider: Cari Caraway, MD   Encounter Date: 06/16/2018  PT End of Session - 06/16/18 1623    Visit Number  8    Number of Visits  10    Date for PT Re-Evaluation  07/07/18    Authorization Type  medicare    PT Start Time  1618    PT Stop Time  1700    PT Time Calculation (min)  42 min    Activity Tolerance  Patient tolerated treatment well    Behavior During Therapy  [facility] for tasks assessed/performed       Past Medical History:  Diagnosis Date  . Breast cancer ([place])    --s/p lumpectomy and XRT --Arimdex  . Chest pain, unspecified    --normal myoview in july 2010   . GERD (gastroesophageal reflux disease)   . Melanoma ([place])   . Personal history of radiation therapy 2007  . Tachycardia    -monitor 12/10. Sr with PACs --echo 12/10. EF normal. no signifcant valvular abnormalities. --ETT normal 4/11    . Thoracic aortic aneurysm [facility])     Past Surgical History:  Procedure Laterality Date  . BREAST BIOPSY Right 2007  . BREAST LUMPECTOMY Right 2007  . INGUINAL HERNIA REPAIR    . MASS EXCISION Right 03/25/2017   Procedure: EXCISION MUCOID CYST WITH DISTAL PHANGEAL JOINT ARTHROTOMY RIGHT INDEX FINGER;  Surgeon: Daryll Brod, MD;  Location: [place];  Service: Orthopedics;  Laterality: Right;  REG/FAB  . Right heel    . TUBAL LIGATION      There were no vitals filed for this visit.  Subjective Assessment - 06/16/18 1620    Subjective  Pain still comes and goes for unknown reasons.  Mostly hurts less but sometimes at night in lower leg.    Currently in Pain?  No/denies                       [facility] Adult PT Treatment/Exercise - 06/16/18 0001      Knee/Hip Exercises:  Stretches   Hip Flexor Stretch  Right;1 rep;60 seconds    Piriformis Stretch  Right;2 reps;20 seconds      Knee/Hip Exercises: Aerobic   Nustep  L1 x 6 min      Knee/Hip Exercises: Standing   SLS with Vectors  3 ways on foam mat - 10x each    Other Standing Knee Exercises  tandem walking      Knee/Hip Exercises: Supine   Straight Leg Raises  Strengthening;Right;20 reps;Limitations    Straight Leg Raises Limitations  2lb      Knee/Hip Exercises: Sidelying   Hip ABduction  Strengthening;Right;20 reps 2#    Hip ADduction  Strengthening;Right;20 reps 2#    Clams  clam and reverse clam - 20x each RtLE red band and 2lb ankle weight      Knee/Hip Exercises: Prone   Straight Leg Raises  Strengthening;Right;15 reps 2#               PT Short Term Goals - 05/26/18 1616      PT SHORT TERM GOAL #1   Title  ind with initial HEP    Status  Achieved        PT Long Term Goals -  06/02/18 1639      PT LONG TERM GOAL #1   Title  Pt will be ind with advanced HEP    Status  On-going      PT LONG TERM GOAL #2   Title  pt reports at least 75% less hip pain    Baseline  there's a good bit less, maybe 50% less    Status  On-going      PT LONG TERM GOAL #3   Title  pt demonstrates 4+/5 right hip abduction, external rotation, and internal rotation for greater hip stability during transfers and walking.    Baseline  4/5 abduction    Status  On-going      PT LONG TERM GOAL #4   Title  FOTO < or = to 36% limited    Status  On-going      PT LONG TERM GOAL #5   Title  right ankle DF at least 10 deg for improved ability to perform functinoal squats and gait mechanics    Baseline  4 deg AROM    Status  On-going            Plan - 06/16/18 1713    Clinical Impression Statement  Pt was able to add resistance to exercises today.  pt needs strong tactile cues to prevent pelvis from moving when performing side lying clams.  Pt is having less frequent pain but still occurring with  the same intensity during certain activities, but not all the time.  Pt will benefit from skilled PT to continue to progress strength and ROM.    PT Treatment/Interventions  ADLs/Self Care Home Management;Biofeedback;Cryotherapy;English as a second language teacher;Therapeutic activities;Therapeutic exercise;Neuromuscular re-education;Patient/family education;Scar mobilization;Manual techniques;Passive range of motion;Dry needling;Taping    PT Next Visit Plan  Right LE strengthening, hip abduction and internal/external rotation, STM to IT band and glutes as needed    PT Home Exercise Plan   Access Code: [email or web address]     Consulted and Agree with Plan of Care  Patient       Patient will benefit from skilled therapeutic intervention in order to improve the following deficits and impairments:  Abnormal gait, Decreased activity tolerance, Difficulty walking, Impaired flexibility, Decreased strength, Decreased range of motion, Postural dysfunction, Increased muscle spasms, Pain, Increased fascial restricitons  Visit Diagnosis: Pain in right hip  Muscle weakness (generalized)  Stiffness of right ankle, not elsewhere classified     Problem List Patient Active Problem List   Diagnosis Date Noted  . Essential hypertension, benign 03/25/2011  . ANEURYSM, THORACIC AORTIC 01/08/2010  . PALPITATIONS 11/09/2009  . ABNORMAL EKG 11/09/2009  . TACHYCARDIA 11/07/2009  . CHEST PAIN-UNSPECIFIED 11/07/2009    Zannie Cove, PT 06/16/2018, 5:15 PM  [facility] [address], [place], [place], [address] Phone: [phone]   Fax:  [phone]  Name: Catherine Holland MRN: [id number] Date of Birth: [birth year]

## 2018-06-18 ENCOUNTER — Encounter: Payer: Self-pay | Admitting: Physical Therapy

## 2018-06-18 ENCOUNTER — Ambulatory Visit: Payer: PRIVATE HEALTH INSURANCE | Attending: Family Medicine | Admitting: Physical Therapy

## 2018-06-18 DIAGNOSIS — M25671 Stiffness of right ankle, not elsewhere classified: Secondary | ICD-10-CM | POA: Diagnosis not present

## 2018-06-18 DIAGNOSIS — M25551 Pain in right hip: Secondary | ICD-10-CM | POA: Insufficient documentation

## 2018-06-18 DIAGNOSIS — M6281 Muscle weakness (generalized): Secondary | ICD-10-CM | POA: Insufficient documentation

## 2018-06-18 NOTE — Therapy (Addendum)
[facility] [address], [place], [place], [address] Phone: [phone]   Fax:  [phone]  Physical Therapy Treatment  Patient Details  Name: Catherine Holland MRN: [id number] Date of Birth: 12/25/1945 Referring Provider: Cari Caraway, MD   Encounter Date: 06/18/2018  PT End of Session - 06/18/18 1620    Visit Number  9    Number of Visits  10    Date for PT Re-Evaluation  07/07/18    Authorization Type  medicare    PT Start Time  8590    PT Stop Time  1657    PT Time Calculation (min)  42 min    Activity Tolerance  Patient tolerated treatment well    Behavior During Therapy  [facility] for tasks assessed/performed       Past Medical History:  Diagnosis Date  . Breast cancer ([place])    --s/p lumpectomy and XRT --Arimdex  . Chest pain, unspecified    --normal myoview in july 2010   . GERD (gastroesophageal reflux disease)   . Melanoma ([place])   . Personal history of radiation therapy 2007  . Tachycardia    -monitor 12/10. Sr with PACs --echo 12/10. EF normal. no signifcant valvular abnormalities. --ETT normal 4/11    . Thoracic aortic aneurysm [facility])     Past Surgical History:  Procedure Laterality Date  . BREAST BIOPSY Right 2007  . BREAST LUMPECTOMY Right 2007  . INGUINAL HERNIA REPAIR    . MASS EXCISION Right 03/25/2017   Procedure: EXCISION MUCOID CYST WITH DISTAL PHANGEAL JOINT ARTHROTOMY RIGHT INDEX FINGER;  Surgeon: Daryll Brod, MD;  Location: [place];  Service: Orthopedics;  Laterality: Right;  REG/FAB  . Right heel    . TUBAL LIGATION      There were no vitals filed for this visit.  Subjective Assessment - 06/18/18 1620    Subjective  Pt states she is much better and feeling like she just needs to be sure she is doing the right things to manage her pain.  Pt states she feels 90% improved.    Currently in Pain?  No/denies         [facility] PT Assessment - 06/18/18 0001      AROM   Right Ankle Dorsiflexion  5      Strength   Right Hip ABduction  4+/5                   [facility] Adult PT Treatment/Exercise - 06/18/18 0001      Knee/Hip Exercises: Stretches   Hip Flexor Stretch  Right;1 rep;60 seconds    Gastroc Stretch  Right;Left;2 reps;30 seconds      Knee/Hip Exercises: Aerobic   Nustep  L1 x 6 min PT present for status update      Knee/Hip Exercises: Standing   Lateral Step Up  20 reps;Step Height: 4";Hand Hold: 1 cues for slow lowering and knee alignment    SLS with Vectors  3 ways on level surface - 5x no UE no resistance; 5 x yellow band 1 UE support      Knee/Hip Exercises: Supine   Bridges  Strengthening;10 reps 5 to 10 sec holds      hip extension in standing with blue band          PT Short Term Goals - 06/18/18 1658      PT SHORT TERM GOAL #2   Title  AROM right ankle increased to 5 degrees  Status  Achieved      PT SHORT TERM GOAL #3   Title  hip pain decreased by 25% for improved ability to return to walking more regularly    Status  Achieved        PT Long Term Goals - 06/18/18 1649      PT LONG TERM GOAL #1   Title  Pt will be ind with advanced HEP    Status  Achieved      PT LONG TERM GOAL #2   Title  pt reports at least 75% less hip pain    Baseline  90%    Status  Achieved      PT LONG TERM GOAL #3   Title  pt demonstrates 4+/5 right hip abduction, external rotation, and internal rotation for greater hip stability during transfers and walking.    Baseline  4+/5 or more    Status  Achieved      PT LONG TERM GOAL #4   Title  FOTO < or = to 36% limited    Baseline  27% limited    Status  Achieved      PT LONG TERM GOAL #5   Title  right ankle DF at least 10 deg for improved ability to perform functinoal squats and gait mechanics    Baseline  5 deg    Status  Partially Met            Plan - 06/18/18 1700    Clinical Impression Statement  Patient reports feeling 90% better overall .  Pt needed  some cues and review of HEP today.  She is doing well with all exercises.  She will discharge from PT unless she experiences any set backs over the weekend.  Her chart will remain open for now to ensure successful transition to HEP.    PT Treatment/Interventions  ADLs/Self Care Home Management;Biofeedback;Cryotherapy;English as a second language teacher;Therapeutic activities;Therapeutic exercise;Neuromuscular re-education;Patient/family education;Scar mobilization;Manual techniques;Passive range of motion;Dry needling;Taping    PT Next Visit Plan  discharge next visit likely    Recommended Other Services  signed    Consulted and Agree with Plan of Care  Patient       Patient will benefit from skilled therapeutic intervention in order to improve the following deficits and impairments:  Abnormal gait, Decreased activity tolerance, Difficulty walking, Impaired flexibility, Decreased strength, Decreased range of motion, Postural dysfunction, Increased muscle spasms, Pain, Increased fascial restricitons  Visit Diagnosis: Pain in right hip  Muscle weakness (generalized)  Stiffness of right ankle, not elsewhere classified     Problem List Patient Active Problem List   Diagnosis Date Noted  . Essential hypertension, benign 03/25/2011  . ANEURYSM, THORACIC AORTIC 01/08/2010  . PALPITATIONS 11/09/2009  . ABNORMAL EKG 11/09/2009  . TACHYCARDIA 11/07/2009  . CHEST PAIN-UNSPECIFIED 11/07/2009    Zannie Cove, PT 06/18/2018, 5:08 PM  [facility] [address], [place], [place], [address] Phone: [phone]   Fax:  [phone]  Name: Catherine Holland MRN: [id number] Date of Birth: [birth year]

## 2018-06-23 ENCOUNTER — Ambulatory Visit: Payer: PRIVATE HEALTH INSURANCE | Admitting: Physical Therapy

## 2018-06-23 DIAGNOSIS — M6281 Muscle weakness (generalized): Secondary | ICD-10-CM | POA: Diagnosis not present

## 2018-06-23 DIAGNOSIS — M25671 Stiffness of right ankle, not elsewhere classified: Secondary | ICD-10-CM | POA: Diagnosis not present

## 2018-06-23 DIAGNOSIS — M25551 Pain in right hip: Secondary | ICD-10-CM | POA: Diagnosis not present

## 2018-06-23 NOTE — Therapy (Addendum)
[facility] [address], [place], [place], [address] Phone: [phone]   Fax:  [phone]  Physical Therapy Treatment Progress Note Reporting Period 05/12/18 to 06/23/18   See note below for Objective Data and Assessment of Progress/Goals.      Patient Details  Name: Catherine Holland MRN: [id number] Date of Birth: [birth year] Referring Provider: Cari Caraway, MD   Encounter Date: 06/23/2018  PT End of Session - 06/23/18 1620    Visit Number  10    Number of Visits  10    Date for PT Re-Evaluation  07/07/18    Authorization Type  medicare    PT Start Time  6270    PT Stop Time  1657    PT Time Calculation (min)  40 min    Activity Tolerance  Patient tolerated treatment well    Behavior During Therapy  [facility] for tasks assessed/performed       Past Medical History:  Diagnosis Date  . Breast cancer ([place])    --s/p lumpectomy and XRT --Arimdex  . Chest pain, unspecified    --normal myoview in july 2010   . GERD (gastroesophageal reflux disease)   . Melanoma ([place])   . Personal history of radiation therapy 2007  . Tachycardia    -monitor 12/10. Sr with PACs --echo 12/10. EF normal. no signifcant valvular abnormalities. --ETT normal 4/11    . Thoracic aortic aneurysm [facility])     Past Surgical History:  Procedure Laterality Date  . BREAST BIOPSY Right 2007  . BREAST LUMPECTOMY Right 2007  . INGUINAL HERNIA REPAIR    . MASS EXCISION Right 03/25/2017   Procedure: EXCISION MUCOID CYST WITH DISTAL PHANGEAL JOINT ARTHROTOMY RIGHT INDEX FINGER;  Surgeon: Daryll Brod, MD;  Location: [place];  Service: Orthopedics;  Laterality: Right;  REG/FAB  . Right heel    . TUBAL LIGATION      There were no vitals filed for this visit.  Subjective Assessment - 06/23/18 1619    Subjective  Pt states she had 5/10 pain Sunday and Monday.  Today it is less but still has pain. States it has been worse at night.       Currently in Pain?  Yes    Pain Score  2     Pain Location  Hip    Pain Orientation  Right    Pain Descriptors / Indicators  Sore    Pain Type  Chronic pain    Pain Onset  More than a month ago    Pain Frequency  Intermittent    Aggravating Factors   didn't notice anything, lying on either side    Multiple Pain Sites  No                       [facility] Adult PT Treatment/Exercise - 06/23/18 0001      Knee/Hip Exercises: Stretches   Hip Flexor Stretch  Right;60 seconds;2 reps    Other Knee/Hip Stretches  LE pendulums right side - 1 min      Knee/Hip Exercises: Aerobic   Nustep  L1 x 7 min PT present for status update      Knee/Hip Exercises: Prone   Other Prone Exercises  hip IR with red band - knees bent - 20x      Manual Therapy   Joint Mobilization  hip mobs to improve extension; lateral glides and A/P mobs, long axis distraction  PT Short Term Goals - 06/18/18 1658      PT SHORT TERM GOAL #2   Title  AROM right ankle increased to 5 degrees    Status  Achieved      PT SHORT TERM GOAL #3   Title  hip pain decreased by 25% for improved ability to return to walking more regularly    Status  Achieved        PT Long Term Goals - 06/18/18 1649      PT LONG TERM GOAL #1   Title  Pt will be ind with advanced HEP    Status  Achieved      PT LONG TERM GOAL #2   Title  pt reports at least 75% less hip pain    Baseline  90%    Status  Achieved      PT LONG TERM GOAL #3   Title  pt demonstrates 4+/5 right hip abduction, external rotation, and internal rotation for greater hip stability during transfers and walking.    Baseline  4+/5 or more    Status  Achieved      PT LONG TERM GOAL #4   Title  FOTO < or = to 36% limited    Baseline  27% limited    Status  Achieved      PT LONG TERM GOAL #5   Title  right ankle DF at least 10 deg for improved ability to perform functinoal squats and gait mechanics    Baseline  5 deg    Status   Partially Met            Plan - 06/23/18 1721    Clinical Impression Statement  Pt had flare up over the last 2 days.  Today patient had progress note and all goals are up to date.  Today she had a little pain which was alleviated after manual techniques.  Pt very tight in right hip extension.  She will continue with HEP the remainder of this week and return in one week.  Pt will benefit from skilled PT to ensure maximum functional outcomes and success with HEP.    PT Treatment/Interventions  ADLs/Self Care Home Management;Biofeedback;Cryotherapy;English as a second language teacher;Therapeutic activities;Therapeutic exercise;Neuromuscular re-education;Patient/family education;Scar mobilization;Manual techniques;Passive range of motion;Dry needling;Taping    PT Next Visit Plan  discharge if feeling better, hip mobs and review stretches/HEP    PT Home Exercise Plan   Access Code: [email or web address]     Consulted and Agree with Plan of Care  Patient       Patient will benefit from skilled therapeutic intervention in order to improve the following deficits and impairments:  Abnormal gait, Decreased activity tolerance, Difficulty walking, Impaired flexibility, Decreased strength, Decreased range of motion, Postural dysfunction, Increased muscle spasms, Pain, Increased fascial restricitons  Visit Diagnosis: Pain in right hip  Muscle weakness (generalized)  Stiffness of right ankle, not elsewhere classified     Problem List Patient Active Problem List   Diagnosis Date Noted  . Essential hypertension, benign 03/25/2011  . ANEURYSM, THORACIC AORTIC 01/08/2010  . PALPITATIONS 11/09/2009  . ABNORMAL EKG 11/09/2009  . TACHYCARDIA 11/07/2009  . CHEST PAIN-UNSPECIFIED 11/07/2009    Zannie Cove, PT 06/23/2018, 5:26 PM  [facility] [address], [place], [place], [address] Phone: [phone]    Fax:  [phone]  Name: Catherine Holland MRN: [id number] Date of Birth: 1946/02/03  PHYSICAL THERAPY DISCHARGE SUMMARY  Visits from Start of Care: 10  Current functional level related to goals / functional outcomes: See above goals   Remaining deficits: See above   Education / Equipment: HEP  Plan: Patient agrees to discharge.  Patient goals were partially met. Patient is being discharged due to meeting the stated rehab goals.  ?????    [employer], PT 08/12/18 12:05 PM

## 2018-06-25 ENCOUNTER — Ambulatory Visit: Payer: PRIVATE HEALTH INSURANCE | Admitting: Physical Therapy

## 2018-06-26 DIAGNOSIS — Z0000 Encounter for general adult medical examination without abnormal findings: Secondary | ICD-10-CM | POA: Diagnosis not present

## 2018-06-26 DIAGNOSIS — M85852 Other specified disorders of bone density and structure, left thigh: Secondary | ICD-10-CM | POA: Diagnosis not present

## 2018-06-26 DIAGNOSIS — E559 Vitamin D deficiency, unspecified: Secondary | ICD-10-CM | POA: Diagnosis not present

## 2018-06-26 DIAGNOSIS — Z1211 Encounter for screening for malignant neoplasm of colon: Secondary | ICD-10-CM | POA: Diagnosis not present

## 2018-06-26 DIAGNOSIS — E782 Mixed hyperlipidemia: Secondary | ICD-10-CM | POA: Diagnosis not present

## 2018-06-26 DIAGNOSIS — R946 Abnormal results of thyroid function studies: Secondary | ICD-10-CM | POA: Diagnosis not present

## 2018-06-26 DIAGNOSIS — Z853 Personal history of malignant neoplasm of breast: Secondary | ICD-10-CM | POA: Diagnosis not present

## 2018-06-26 DIAGNOSIS — I712 Thoracic aortic aneurysm, without rupture: Secondary | ICD-10-CM | POA: Diagnosis not present

## 2018-06-29 DIAGNOSIS — I712 Thoracic aortic aneurysm, without rupture: Secondary | ICD-10-CM | POA: Diagnosis not present

## 2018-06-29 DIAGNOSIS — Z0000 Encounter for general adult medical examination without abnormal findings: Secondary | ICD-10-CM | POA: Diagnosis not present

## 2018-06-29 DIAGNOSIS — E782 Mixed hyperlipidemia: Secondary | ICD-10-CM | POA: Diagnosis not present

## 2018-06-29 DIAGNOSIS — Z853 Personal history of malignant neoplasm of breast: Secondary | ICD-10-CM | POA: Diagnosis not present

## 2018-06-29 DIAGNOSIS — Z85820 Personal history of malignant melanoma of skin: Secondary | ICD-10-CM | POA: Diagnosis not present

## 2018-06-29 DIAGNOSIS — M85852 Other specified disorders of bone density and structure, left thigh: Secondary | ICD-10-CM | POA: Diagnosis not present

## 2018-06-29 DIAGNOSIS — Z23 Encounter for immunization: Secondary | ICD-10-CM | POA: Diagnosis not present

## 2018-06-29 DIAGNOSIS — K625 Hemorrhage of anus and rectum: Secondary | ICD-10-CM | POA: Diagnosis not present

## 2018-06-30 ENCOUNTER — Ambulatory Visit: Payer: PRIVATE HEALTH INSURANCE | Admitting: Physical Therapy

## 2018-07-02 ENCOUNTER — Encounter: Payer: PRIVATE HEALTH INSURANCE | Admitting: Physical Therapy

## 2018-07-07 ENCOUNTER — Encounter: Payer: PRIVATE HEALTH INSURANCE | Admitting: Physical Therapy

## 2018-07-09 ENCOUNTER — Encounter: Payer: PRIVATE HEALTH INSURANCE | Admitting: Physical Therapy

## 2018-07-30 DIAGNOSIS — K921 Melena: Secondary | ICD-10-CM | POA: Diagnosis not present

## 2018-07-30 DIAGNOSIS — K5900 Constipation, unspecified: Secondary | ICD-10-CM | POA: Diagnosis not present

## 2018-08-06 DIAGNOSIS — L821 Other seborrheic keratosis: Secondary | ICD-10-CM | POA: Diagnosis not present

## 2018-08-06 DIAGNOSIS — Z86018 Personal history of other benign neoplasm: Secondary | ICD-10-CM | POA: Diagnosis not present

## 2018-08-06 DIAGNOSIS — D2272 Melanocytic nevi of left lower limb, including hip: Secondary | ICD-10-CM | POA: Diagnosis not present

## 2018-08-06 DIAGNOSIS — D239 Other benign neoplasm of skin, unspecified: Secondary | ICD-10-CM | POA: Diagnosis not present

## 2018-08-06 DIAGNOSIS — D1800 Hemangioma unspecified site: Secondary | ICD-10-CM | POA: Diagnosis not present

## 2018-08-06 DIAGNOSIS — D225 Melanocytic nevi of trunk: Secondary | ICD-10-CM | POA: Diagnosis not present

## 2018-08-06 DIAGNOSIS — L814 Other melanin hyperpigmentation: Secondary | ICD-10-CM | POA: Diagnosis not present

## 2018-08-06 DIAGNOSIS — Z85820 Personal history of malignant melanoma of skin: Secondary | ICD-10-CM | POA: Diagnosis not present

## 2018-08-10 DIAGNOSIS — M8588 Other specified disorders of bone density and structure, other site: Secondary | ICD-10-CM | POA: Diagnosis not present

## 2018-08-28 DIAGNOSIS — D122 Benign neoplasm of ascending colon: Secondary | ICD-10-CM | POA: Diagnosis not present

## 2018-08-28 DIAGNOSIS — K642 Third degree hemorrhoids: Secondary | ICD-10-CM | POA: Diagnosis not present

## 2018-08-28 DIAGNOSIS — K5730 Diverticulosis of large intestine without perforation or abscess without bleeding: Secondary | ICD-10-CM | POA: Diagnosis not present

## 2018-08-28 DIAGNOSIS — K921 Melena: Secondary | ICD-10-CM | POA: Diagnosis not present

## 2018-09-22 ENCOUNTER — Encounter: Payer: Self-pay | Admitting: Cardiology

## 2018-09-22 ENCOUNTER — Ambulatory Visit (INDEPENDENT_AMBULATORY_CARE_PROVIDER_SITE_OTHER): Payer: PRIVATE HEALTH INSURANCE | Admitting: Cardiology

## 2018-09-22 VITALS — BP 140/70 | HR 71 | Ht 65.0 in | Wt 154.8 lb

## 2018-09-22 DIAGNOSIS — I712 Thoracic aortic aneurysm, without rupture, unspecified: Secondary | ICD-10-CM

## 2018-09-22 DIAGNOSIS — R072 Precordial pain: Secondary | ICD-10-CM | POA: Diagnosis not present

## 2018-09-22 DIAGNOSIS — E785 Hyperlipidemia, unspecified: Secondary | ICD-10-CM

## 2018-09-22 DIAGNOSIS — R002 Palpitations: Secondary | ICD-10-CM | POA: Diagnosis not present

## 2018-09-22 DIAGNOSIS — I10 Essential (primary) hypertension: Secondary | ICD-10-CM

## 2018-09-22 HISTORY — DX: Hyperlipidemia, unspecified: E78.5

## 2018-09-22 NOTE — Progress Notes (Signed)
Cardiology Consultation:    Date:  09/22/2018   ID:  Catherine Holland, DOB Jun 02, 1946, MRN [id number]  PCP:  Cari Caraway, MD  Cardiologist:  Jenne Campus, MD   Referring MD: Cari Caraway, MD   Chief Complaint  Patient presents with  . Jaw Pain  Left shoulder and joint pain  History of Present Illness:    Catherine Holland is a 72 y.o. female who is being seen today for the evaluation of shoulder and jaw pain at the request of Cari Caraway, MD.  Few weeks ago she was sitting at the desk started having pain localized in the left shoulder area she is actually pinpointing location just below the mandible on the left side.  It lasted all day she graded the pain to in scale up to 10 there were no aggravating no relieving factors.  The pain went away eventually by itself.  Few days later she experienced a left shoulder pain that was was milder, less than 2 in scale up to 10.  Moving her shoulder did not make this worse pressing chest wall may make it slightly worse no shortness of breath no palpitation no sweating associated with this sensation again pain lasted almost all day.  She is very active however does not exercise on the regular basis she can walk climb stairs with no difficulties.  Never had any heart trouble.  She was diagnosed with descending aortic aneurysm actually connection of the arch with ascending aortic Enlargement of 3.5 cm last estimation of this area was done by CAT scan in July 2017.  She never smoked does have some family history of premature coronary artery disease.  She tells me her cholesterol is elevated but not to the point that therapy is needed.  Past Medical History:  Diagnosis Date  . Breast cancer ([place])    --s/p lumpectomy and XRT --Arimdex  . Chest pain, unspecified    --normal myoview in july 2010   . GERD (gastroesophageal reflux disease)   . Melanoma ([place])   . Personal history of radiation therapy 2007  . Tachycardia    -monitor 12/10. Sr with  PACs --echo 12/10. EF normal. no signifcant valvular abnormalities. --ETT normal 4/11    . Thoracic aortic aneurysm [facility])     Past Surgical History:  Procedure Laterality Date  . BREAST BIOPSY Right 2007  . BREAST LUMPECTOMY Right 2007  . INGUINAL HERNIA REPAIR    . MASS EXCISION Right 03/25/2017   Procedure: EXCISION MUCOID CYST WITH DISTAL PHANGEAL JOINT ARTHROTOMY RIGHT INDEX FINGER;  Surgeon: Daryll Brod, MD;  Location: [place];  Service: Orthopedics;  Laterality: Right;  REG/FAB  . Right heel    . TUBAL LIGATION      Current Medications: Current Meds  Medication Sig  . aspirin 81 MG EC tablet Take 81 mg by mouth daily.    . cholecalciferol (VITAMIN D) 1000 UNITS tablet Take 2 tabs once daily.   . Multiple Vitamin (MULTIVITAMIN) tablet Take 1 tablet by mouth daily.       Allergies:   Adhesive [tape] and Meperidine hcl   Social History   Socioeconomic History  . Marital status: Married    Spouse name: Not on file  . Number of children: 2  . Years of education: Not on file  . Highest education level: Not on file  Occupational History  . 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 Financial resource strain:  Not on file  . Food insecurity:    Worry: Not on file    Inability: Not on file  . Transportation needs:    Medical: Not on file    Non-medical: Not on file  Tobacco Use  . Smoking status: Never Smoker  . Smokeless tobacco: Never Used  Substance and Sexual Activity  . Alcohol use: Yes    Comment: Half a glass of red wine but not everyday.  . Drug use: No  . Sexual activity: Not on file  Lifestyle  . Physical activity:    Days per week: Not on file    Minutes per session: Not on file  . Stress: Not on file  Relationships  . Social connections:    Talks on phone: Not on file    Gets together: Not on file    Attends religious service: Not on file    Active member of club or  organization: Not on file    Attends meetings of clubs or organizations: Not on file    Relationship status: Not on file  Other Topics Concern  . Not on file  Social History Narrative  . Not on file     Family History: The patient's family history includes Angina in her unknown relative; Diabetes in her father; Heart attack in her mother; Heart disease in her brother and brother; Hypertension in her mother and unknown relative; Liver disease in her unknown relative; Stroke in her unknown relative. There is no history of Breast cancer. ROS:   Please see the history of present illness.    All 14 point review of systems negative except as described per history of present illness.  EKGs/Labs/Other Studies Reviewed:    The following studies were reviewed today: CT of her chest reviewed showed aneurysm but no other worrisome cardiovascular findings  EKG:  EKG is  ordered today.  The ekg ordered today demonstrates normal sinus rhythm possible left atrial enlargement nonspecific ST segment changes  Recent Labs: No results found for requested labs within last 8760 hours.  Recent Lipid Panel    Component Value Date/Time   CHOL (H) 06/02/2009 0108    205        ATP III CLASSIFICATION:  <200     mg/dL   Desirable  200-239  mg/dL   Borderline High  >=240    mg/dL   High          TRIG 116 06/02/2009 0108   HDL 65 06/02/2009 0108   CHOLHDL 3.2 06/02/2009 0108   VLDL 23 06/02/2009 0108   LDLCALC (H) 06/02/2009 0108    117        Total Cholesterol/HDL:CHD Risk Coronary Heart Disease Risk Table                     Men   Women  1/2 Average Risk   3.4   3.3  Average Risk       5.0   4.4  2 X Average Risk   9.6   7.1  3 X Average Risk  23.4   11.0        Use the calculated Patient Ratio above and the CHD Risk Table to determine the patient's CHD Risk.        ATP III CLASSIFICATION (LDL):  <100     mg/dL   Optimal  100-129  mg/dL   Near or Above  Optimal  130-159   mg/dL   Borderline  160-189  mg/dL   High  >190     mg/dL   Very High    Physical Exam:    VS:  BP 140/70   Pulse 71   Ht 5\' 5"  (1.651 m)   Wt 154 lb 12.8 oz (70.2 kg)   SpO2 97%   BMI 25.76 kg/m     Wt Readings from Last 3 Encounters:  09/22/18 154 lb 12.8 oz (70.2 kg)  03/25/17 155 lb 8 oz (70.5 kg)  03/25/11 171 lb 6.4 oz (77.7 kg)     GEN:  Well nourished, well developed in no acute distress HEENT: Normal NECK: No JVD; No carotid bruits LYMPHATICS: No lymphadenopathy CARDIAC: RRR, no murmurs, no rubs, no gallops RESPIRATORY:  Clear to auscultation without rales, wheezing or rhonchi  ABDOMEN: Soft, non-tender, non-distended MUSCULOSKELETAL:  No edema; No deformity  SKIN: Warm and dry NEUROLOGIC:  Alert and oriented x 3 PSYCHIATRIC:  Normal affect   ASSESSMENT:    1. Thoracic aortic aneurysm without rupture ([place])   2. Precordial pain   3. Palpitations   4. Dyslipidemia    PLAN:    In order of problems listed above:  1. Left jaw and shoulder pain.  This does have some very atypical characteristic for heart cardiac pain.  However she does have some aneurysm she does have some family history of premature coronary artery disease therefore I think the best course of action will be to proceed with stress testing I will schedule him to have stress echocardiogram.  Also the fact that her aneurysm was checked last time more than 20 years ago I will consider redoing CT of her chest.  She is convinced that she had CT of her chest done last year.  We will try to check if that is the fact if so then we will not do CT of her chest. 2. Dyslipidemia we will request her fasting lipid profile 3. Palpitations denies having any 4. Thoracic and was discussion as above   Medication Adjustments/Labs and Tests Ordered: Current medicines are reviewed at length with the patient today.  Concerns regarding medicines are outlined above.  No orders of the defined types were placed in this  encounter.  No orders of the defined types were placed in this encounter.   Signed, Park Liter, MD, [facility]. 09/22/2018 2:42 PM    [facility]

## 2018-09-22 NOTE — Patient Instructions (Addendum)
Medication Instructions:  Your physician recommends that you continue on your current medications as directed. Please refer to the Current Medication list given to you today.  If you need a refill on your cardiac medications before your next appointment, please call your pharmacy.   Lab work: None.  If you have labs (blood work) drawn today and your tests are completely normal, you will receive your results only by: Marland Kitchen MyChart Message (if you have MyChart) OR . A paper copy in the mail If you have any lab test that is abnormal or we need to change your treatment, we will call you to review the results.  Testing/Procedures: Your physician has requested that you have a stress echocardiogram. For further information please visit [email or web address]. Please follow instruction sheet as given.  Non-Cardiac CT Angiography (CTA), is a special type of CT scan that uses a computer to produce multi-dimensional views of major blood vessels throughout the body. In CT angiography, a contrast material is injected through an IV to help visualize the blood vessels     Follow-Up: At [facility], you and your health needs are our priority.  As part of our continuing mission to provide you with exceptional heart care, we have created designated Provider Care Teams.  These Care Teams include your primary Cardiologist (physician) and Advanced Practice Providers (APPs -  Physician Assistants and Nurse Practitioners) who all work together to provide you with the care you need, when you need it. You will need a follow up appointment in 1 months.  Please call our office 2 months in advance to schedule this appointment.  You may see No primary care provider on file. or another member of our [employer] Provider Team in [place]: Shirlee More, MD . Jyl Heinz, MD  Any Other Special Instructions Will Be Listed Below (If Applicable).   Exercise Stress Echocardiogram An exercise stress echocardiogram is a  test to check how well your heart is working. This test uses sound waves (ultrasound) and a computer to make images of your heart before and after exercise. Ultrasound images that are taken before you exercise (your resting echocardiogram) will show how much blood is getting to your heart muscle and how well your heart muscle and heart valves are functioning. During the next part of this test, you will walk on a treadmill or ride a stationary bike to see how exercise affects your heart. While you exercise, the electrical activity of your heart will be monitored with an electrocardiogram (ECG). Your blood pressure will also be monitored. You may have this test if you:  Have chest pain or other symptoms of a heart problem.  Recently had a heart attack or heart surgery.  Have heart valve problems.  Have a condition that causes narrowing of the blood vessels that supply your heart (coronary artery disease).  Have a high risk of heart disease and are starting a new exercise program.  Have a high risk of heart disease and need to have major surgery.  Tell a health care provider about:  Any allergies you have.  All medicines you are taking, including vitamins, herbs, eye drops, creams, and over-the-counter medicines.  Any problems you or family members have had with anesthetic medicines.  Any blood disorders you have.  Any surgeries you have had.  Any medical conditions you have.  Whether you are pregnant or may be pregnant. What are the risks? Generally, this is a safe procedure. However, problems may occur, including:  Chest pain.  Dizziness or light-headedness.  Shortness of breath.  Increased or irregular heartbeat (palpitations).  Nausea or vomiting.  Heart attack (very rare).  What happens before the procedure?  Follow instructions from your health care provider about eating or drinking restrictions. You may be asked to avoid all forms of caffeine for 24 hours before  your procedure, or as told by your health care provider.  Ask your health care provider about changing or stopping your regular medicines. This is especially important if you are taking diabetes medicines or blood thinners.  If you use an inhaler, bring it with you to the test.  Wear loose, comfortable clothing and walking shoes.  Do notuse any products that contain nicotine or tobacco, such as cigarettes and e-cigarettes, for 4 hours before the test or as told by your health care provider. If you need help quitting, ask your health care provider. What happens during the procedure?  You will take off your clothes from the waist up and put on a hospital gown.  A technician will place electrodes on your chest.  A blood pressure cuff will be placed on your arm.  You will lie down on a table for an ultrasound exam before you exercise. Gel will be rubbed on your chest, and a handheld device (transducer) will be pressed against your chest and moved over your heart.  Then, you will start exercising by walking on a treadmill or pedaling a stationary bicycle.  Your blood pressure and heart rhythm will be monitored while you exercise.  The exercise will gradually get harder or faster.  You will exercise until: ? Your heart reaches a target level. ? You are too tired to continue. ? You cannot continue because of chest pain, weakness, or dizziness.  You will have another ultrasound exam after you stop exercising. The procedure may vary among health care providers and hospitals. What happens after the procedure?  Your heart rate and blood pressure will be monitored until they return to your normal levels. Summary  An exercise stress echocardiogram is a test that uses ultrasound to check how well your heart works before and after exercise.  Before the test, follow instructions from your health care provider about stopping medications, avoiding nicotine and tobacco, and avoiding certain  foods and drinks.  During the test, your blood pressure and heart rhythm will be monitored while you exercise on a treadmill or stationary bicycle. This information is not intended to replace advice given to you by your health care provider. Make sure you discuss any questions you have with your health care provider. Document Released: 11/08/2004 Document Revised: 06/26/2016 Document Reviewed: 06/26/2016 Elsevier Interactive Patient Education  2018 [employer].

## 2018-09-23 LAB — BASIC METABOLIC PANEL
BUN/Creatinine Ratio: 24 (ref 12–28)
BUN: 16 mg/dL (ref 8–27)
CALCIUM: 9.8 mg/dL (ref 8.7–10.3)
CO2: 21 mmol/L (ref 20–29)
Chloride: 102 mmol/L (ref 96–106)
Creatinine, Ser: 0.67 mg/dL (ref 0.57–1.00)
GFR, EST AFRICAN AMERICAN: 102 mL/min/{1.73_m2} (ref >59)
GFR, EST NON AFRICAN AMERICAN: 89 mL/min/{1.73_m2} (ref >59)
Glucose: 92 mg/dL (ref 65–99)
Potassium: 5.1 mmol/L (ref 3.5–5.2)
Sodium: 142 mmol/L (ref 134–144)

## 2018-10-05 ENCOUNTER — Telehealth: Payer: Self-pay | Admitting: Cardiology

## 2018-10-05 NOTE — Telephone Encounter (Signed)
Please call regarding taking antibiotics during stress test and CT

## 2018-10-05 NOTE — Telephone Encounter (Signed)
Patient informed that this is okay per Dr. Agustin Cree

## 2018-10-06 ENCOUNTER — Other Ambulatory Visit (HOSPITAL_COMMUNITY): Payer: PRIVATE HEALTH INSURANCE

## 2018-10-07 ENCOUNTER — Ambulatory Visit (HOSPITAL_COMMUNITY): Payer: PRIVATE HEALTH INSURANCE | Attending: Cardiology

## 2018-10-07 ENCOUNTER — Ambulatory Visit (INDEPENDENT_AMBULATORY_CARE_PROVIDER_SITE_OTHER)
Admission: RE | Admit: 2018-10-07 | Discharge: 2018-10-07 | Disposition: A | Discharge status: Home / Self Care | Payer: PRIVATE HEALTH INSURANCE | Source: Ambulatory Visit | Attending: Cardiology | Admitting: Cardiology

## 2018-10-07 ENCOUNTER — Ambulatory Visit (HOSPITAL_BASED_OUTPATIENT_CLINIC_OR_DEPARTMENT_OTHER): Payer: PRIVATE HEALTH INSURANCE

## 2018-10-07 DIAGNOSIS — I712 Thoracic aortic aneurysm, without rupture: Secondary | ICD-10-CM

## 2018-10-07 DIAGNOSIS — R072 Precordial pain: Secondary | ICD-10-CM

## 2018-10-07 DIAGNOSIS — R002 Palpitations: Secondary | ICD-10-CM | POA: Diagnosis not present

## 2018-10-07 MED ORDER — IOPAMIDOL (ISOVUE-300) INJECTION 61%
100.0000 mL | Freq: Once | INTRAVENOUS | Status: AC | PRN
  Administered 2018-10-07: 100 mL via INTRAVENOUS

## 2018-10-14 ENCOUNTER — Encounter: Payer: Self-pay | Admitting: Cardiology

## 2018-10-14 ENCOUNTER — Ambulatory Visit (INDEPENDENT_AMBULATORY_CARE_PROVIDER_SITE_OTHER): Payer: PRIVATE HEALTH INSURANCE | Admitting: Cardiology

## 2018-10-14 VITALS — BP 130/84 | HR 74 | Ht 65.0 in | Wt 156.1 lb

## 2018-10-14 DIAGNOSIS — R072 Precordial pain: Secondary | ICD-10-CM

## 2018-10-14 DIAGNOSIS — I712 Thoracic aortic aneurysm, without rupture, unspecified: Secondary | ICD-10-CM

## 2018-10-14 DIAGNOSIS — I10 Essential (primary) hypertension: Secondary | ICD-10-CM

## 2018-10-14 NOTE — Progress Notes (Signed)
Cardiology Office Note:    Date:  10/14/2018   ID:  Catherine Holland, DOB 1946-02-02, MRN [id number]  PCP:  Catherine Caraway, MD  Cardiologist:  Catherine Campus, MD    Referring MD: Catherine Caraway, MD   Chief Complaint  Patient presents with  . Follow up Stress Echo  Doing well still has some neck pain and chest pain not related to exercise  History of Present Illness:    Catherine Holland is a 72 y.o. female who was referred to me because of atypical chest pain going towards the neck she does have history of proximal descending aortic aneurysm and last evaluation of this problem was done 10 years ago for a part of evaluation for her complaint this time was CT of her chest which likely showed mild aneurysm without any changes as compared to test done 10 years ago.  Also because of risk factors for coronary artery disease we did stress test stress test was negative for exercise-induced myocardial ischemia she feels better overall still does have some pain in the neck not related to exercise.  She may need to see primary care physician for it.  Past Medical History:  Diagnosis Date  . Breast cancer ([place])    --s/p lumpectomy and XRT --Arimdex  . Chest pain, unspecified    --normal myoview in july 2010   . GERD (gastroesophageal reflux disease)   . Melanoma ([place])   . Personal history of radiation therapy 2007  . Tachycardia    -monitor 12/10. Sr with PACs --echo 12/10. EF normal. no signifcant valvular abnormalities. --ETT normal 4/11    . Thoracic aortic aneurysm [facility])     Past Surgical History:  Procedure Laterality Date  . BREAST BIOPSY Right 2007  . BREAST LUMPECTOMY Right 2007  . INGUINAL HERNIA REPAIR    . MASS EXCISION Right 03/25/2017   Procedure: EXCISION MUCOID CYST WITH DISTAL PHANGEAL JOINT ARTHROTOMY RIGHT INDEX FINGER;  Surgeon: Catherine Brod, MD;  Location: [place];  Service: Orthopedics;  Laterality: Right;  REG/FAB  . Right heel    . TUBAL LIGATION       Current Medications: Current Meds  Medication Sig  . aspirin 81 MG EC tablet Take 81 mg by mouth daily.    . cholecalciferol (VITAMIN D) 1000 UNITS tablet Take 2 tabs once daily.   . Multiple Vitamin (MULTIVITAMIN) tablet Take 1 tablet by mouth daily.       Allergies:   Adhesive [tape] and Meperidine hcl   Social History   Socioeconomic History  . Marital status: Married    Spouse name: Not on file  . Number of children: 2  . Years of education: Not on file  . Highest education level: Not on file  Occupational History  . 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 Financial resource strain: Not on file  . Food insecurity:    Worry: Not on file    Inability: Not on file  . Transportation needs:    Medical: Not on file    Non-medical: Not on file  Tobacco Use  . Smoking status: Never Smoker  . Smokeless tobacco: Never Used  Substance and Sexual Activity  . Alcohol use: Yes    Comment: Half a glass of red wine but not everyday.  . Drug use: No  . Sexual activity: Not on file  Lifestyle  . Physical activity:    Days per week: Not on file  Minutes per session: Not on file  . Stress: Not on file  Relationships  . Social connections:    Talks on phone: Not on file    Gets together: Not on file    Attends religious service: Not on file    Active member of club or organization: Not on file    Attends meetings of clubs or organizations: Not on file    Relationship status: Not on file  Other Topics Concern  . Not on file  Social History Narrative  . Not on file     Family History: The patient's family history includes Angina in her unknown relative; Diabetes in her father; Heart attack in her mother; Heart disease in her brother and brother; Hypertension in her mother and unknown relative; Liver disease in her unknown relative; Stroke in her unknown relative. There is no history of Breast cancer. ROS:    Please see the history of present illness.    All 14 point review of systems negative except as described per history of present illness  EKGs/Labs/Other Studies Reviewed:      Recent Labs: 09/22/2018: BUN 16; Creatinine, Ser 0.67; Potassium 5.1; Sodium 142  Recent Lipid Panel    Component Value Date/Time   CHOL (H) 06/02/2009 0108    205        ATP III CLASSIFICATION:  <200     mg/dL   Desirable  200-239  mg/dL   Borderline High  >=240    mg/dL   High          TRIG 116 06/02/2009 0108   HDL 65 06/02/2009 0108   CHOLHDL 3.2 06/02/2009 0108   VLDL 23 06/02/2009 0108   LDLCALC (H) 06/02/2009 0108    117        Total Cholesterol/HDL:CHD Risk Coronary Heart Disease Risk Table                     Men   Women  1/2 Average Risk   3.4   3.3  Average Risk       5.0   4.4  2 X Average Risk   9.6   7.1  3 X Average Risk  23.4   11.0        Use the calculated Patient Ratio above and the CHD Risk Table to determine the patient's CHD Risk.        ATP III CLASSIFICATION (LDL):  <100     mg/dL   Optimal  100-129  mg/dL   Near or Above                    Optimal  130-159  mg/dL   Borderline  160-189  mg/dL   High  >190     mg/dL   Very High    Physical Exam:    VS:  BP 130/84   Pulse 74   Ht 5\' 5"  (1.651 m)   Wt 156 lb 1.9 oz (70.8 kg)   SpO2 97%   BMI 25.98 kg/m     Wt Readings from Last 3 Encounters:  10/14/18 156 lb 1.9 oz (70.8 kg)  09/22/18 154 lb 12.8 oz (70.2 kg)  03/25/17 155 lb 8 oz (70.5 kg)     GEN:  Well nourished, well developed in no acute distress HEENT: Normal NECK: No JVD; No carotid bruits LYMPHATICS: No lymphadenopathy CARDIAC: RRR, no murmurs, no rubs, no gallops RESPIRATORY:  Clear to auscultation without rales, wheezing or rhonchi  ABDOMEN: Soft, non-tender, non-distended MUSCULOSKELETAL:  No edema; No deformity  SKIN: Warm and dry LOWER EXTREMITIES: no swelling NEUROLOGIC:  Alert and oriented x 3 PSYCHIATRIC:  Normal affect    ASSESSMENT:    1. Thoracic aortic aneurysm without rupture ([place])   2. Essential hypertension, benign   3. Precordial pain    PLAN:    In order of problems listed above:  1. Thoracic aortic aneurysm without rupture unchanged within last 10 years.  We will continue conservative approach. 2. Essential hypertension blood pressure looks good continue present management. 3. Precordial chest pain look like this is noncardiac.  Stress test negative we will continue present management.   Medication Adjustments/Labs and Tests Ordered: Current medicines are reviewed at length with the patient today.  Concerns regarding medicines are outlined above.  No orders of the defined types were placed in this encounter.  Medication changes: No orders of the defined types were placed in this encounter.   Signed, Park Liter, MD, [facility] 10/14/2018 4:04 PM    [facility]

## 2018-10-14 NOTE — Patient Instructions (Signed)

## 2018-12-03 DIAGNOSIS — M549 Dorsalgia, unspecified: Secondary | ICD-10-CM | POA: Diagnosis not present

## 2019-01-11 DIAGNOSIS — M71372 Other bursal cyst, left ankle and foot: Secondary | ICD-10-CM | POA: Diagnosis not present

## 2019-03-15 ENCOUNTER — Telehealth: Payer: Self-pay | Admitting: Cardiology

## 2019-03-15 NOTE — Telephone Encounter (Signed)
Patient called returning your call. Please call her back

## 2019-03-16 ENCOUNTER — Telehealth: Payer: Self-pay | Admitting: Cardiology

## 2019-03-16 NOTE — Telephone Encounter (Signed)
Called pt to schedule appt from recall list for May, pt stated she did not know why she needed this appt and wanted to discuss some things with the nurse about previous testing before scheduling(or not scheduling the appt)

## 2019-03-16 NOTE — Telephone Encounter (Signed)
Called patient back she reports he was due for a 6 month follow up however she has no concerns and doesn't feel the appointment is necessary. Patient advised to call is if she wants [place] to reschedule or if she has any other clinical concerns.

## 2019-04-23 ENCOUNTER — Other Ambulatory Visit: Payer: Self-pay | Admitting: Family Medicine

## 2019-04-23 DIAGNOSIS — Z1231 Encounter for screening mammogram for malignant neoplasm of breast: Secondary | ICD-10-CM

## 2019-05-08 DIAGNOSIS — C50919 Malignant neoplasm of unspecified site of unspecified female breast: Secondary | ICD-10-CM

## 2019-05-08 HISTORY — DX: Malignant neoplasm of unspecified site of unspecified female breast: C50.919

## 2019-06-11 ENCOUNTER — Ambulatory Visit: Payer: PRIVATE HEALTH INSURANCE

## 2019-07-02 ENCOUNTER — Ambulatory Visit
Admission: RE | Admit: 2019-07-02 | Discharge: 2019-07-02 | Disposition: A | Discharge status: Home / Self Care | Payer: PRIVATE HEALTH INSURANCE | Source: Ambulatory Visit | Attending: Family Medicine | Admitting: Family Medicine

## 2019-07-02 ENCOUNTER — Other Ambulatory Visit: Payer: Self-pay

## 2019-07-02 DIAGNOSIS — R945 Abnormal results of liver function studies: Secondary | ICD-10-CM | POA: Diagnosis not present

## 2019-07-02 DIAGNOSIS — Z1231 Encounter for screening mammogram for malignant neoplasm of breast: Secondary | ICD-10-CM

## 2019-07-02 DIAGNOSIS — E559 Vitamin D deficiency, unspecified: Secondary | ICD-10-CM | POA: Diagnosis not present

## 2019-07-02 DIAGNOSIS — M85852 Other specified disorders of bone density and structure, left thigh: Secondary | ICD-10-CM | POA: Diagnosis not present

## 2019-07-02 DIAGNOSIS — E782 Mixed hyperlipidemia: Secondary | ICD-10-CM | POA: Diagnosis not present

## 2019-07-02 DIAGNOSIS — R946 Abnormal results of thyroid function studies: Secondary | ICD-10-CM | POA: Diagnosis not present

## 2019-07-06 DIAGNOSIS — M85852 Other specified disorders of bone density and structure, left thigh: Secondary | ICD-10-CM | POA: Diagnosis not present

## 2019-07-06 DIAGNOSIS — Z85820 Personal history of malignant melanoma of skin: Secondary | ICD-10-CM | POA: Diagnosis not present

## 2019-07-06 DIAGNOSIS — I712 Thoracic aortic aneurysm, without rupture: Secondary | ICD-10-CM | POA: Diagnosis not present

## 2019-07-06 DIAGNOSIS — E782 Mixed hyperlipidemia: Secondary | ICD-10-CM | POA: Diagnosis not present

## 2019-07-06 DIAGNOSIS — M545 Low back pain: Secondary | ICD-10-CM | POA: Diagnosis not present

## 2019-07-06 DIAGNOSIS — E559 Vitamin D deficiency, unspecified: Secondary | ICD-10-CM | POA: Diagnosis not present

## 2019-07-06 DIAGNOSIS — R0989 Other specified symptoms and signs involving the circulatory and respiratory systems: Secondary | ICD-10-CM | POA: Diagnosis not present

## 2019-07-06 DIAGNOSIS — K5730 Diverticulosis of large intestine without perforation or abscess without bleeding: Secondary | ICD-10-CM | POA: Diagnosis not present

## 2019-07-06 DIAGNOSIS — G8929 Other chronic pain: Secondary | ICD-10-CM | POA: Diagnosis not present

## 2019-07-06 DIAGNOSIS — Z853 Personal history of malignant neoplasm of breast: Secondary | ICD-10-CM | POA: Diagnosis not present

## 2019-07-06 DIAGNOSIS — M7061 Trochanteric bursitis, right hip: Secondary | ICD-10-CM | POA: Diagnosis not present

## 2019-07-06 DIAGNOSIS — Z0001 Encounter for general adult medical examination with abnormal findings: Secondary | ICD-10-CM | POA: Diagnosis not present

## 2019-07-09 ENCOUNTER — Other Ambulatory Visit: Payer: Self-pay | Admitting: Family Medicine

## 2019-07-09 DIAGNOSIS — R0989 Other specified symptoms and signs involving the circulatory and respiratory systems: Secondary | ICD-10-CM

## 2019-07-12 ENCOUNTER — Other Ambulatory Visit: Payer: Self-pay | Admitting: Family Medicine

## 2019-07-12 DIAGNOSIS — I712 Thoracic aortic aneurysm, without rupture, unspecified: Secondary | ICD-10-CM

## 2019-07-13 ENCOUNTER — Other Ambulatory Visit: Payer: Self-pay | Admitting: Orthopedic Surgery

## 2019-07-13 DIAGNOSIS — M67472 Ganglion, left ankle and foot: Secondary | ICD-10-CM

## 2019-07-13 DIAGNOSIS — M71372 Other bursal cyst, left ankle and foot: Secondary | ICD-10-CM | POA: Diagnosis not present

## 2019-07-13 HISTORY — DX: Ganglion, left ankle and foot: M67.472

## 2019-07-19 ENCOUNTER — Ambulatory Visit
Admission: RE | Admit: 2019-07-19 | Discharge: 2019-07-19 | Disposition: A | Discharge status: Home / Self Care | Payer: PRIVATE HEALTH INSURANCE | Source: Ambulatory Visit | Attending: Family Medicine | Admitting: Family Medicine

## 2019-07-19 DIAGNOSIS — R0989 Other specified symptoms and signs involving the circulatory and respiratory systems: Secondary | ICD-10-CM | POA: Diagnosis not present

## 2019-07-29 ENCOUNTER — Ambulatory Visit: Payer: PRIVATE HEALTH INSURANCE

## 2019-08-09 ENCOUNTER — Other Ambulatory Visit: Payer: PRIVATE HEALTH INSURANCE

## 2019-08-09 DIAGNOSIS — L814 Other melanin hyperpigmentation: Secondary | ICD-10-CM | POA: Diagnosis not present

## 2019-08-09 DIAGNOSIS — Z23 Encounter for immunization: Secondary | ICD-10-CM | POA: Diagnosis not present

## 2019-08-09 DIAGNOSIS — Z85820 Personal history of malignant melanoma of skin: Secondary | ICD-10-CM | POA: Diagnosis not present

## 2019-08-09 DIAGNOSIS — D2272 Melanocytic nevi of left lower limb, including hip: Secondary | ICD-10-CM | POA: Diagnosis not present

## 2019-08-09 DIAGNOSIS — Z86018 Personal history of other benign neoplasm: Secondary | ICD-10-CM | POA: Diagnosis not present

## 2019-08-09 DIAGNOSIS — D225 Melanocytic nevi of trunk: Secondary | ICD-10-CM | POA: Diagnosis not present

## 2019-08-09 DIAGNOSIS — L821 Other seborrheic keratosis: Secondary | ICD-10-CM | POA: Diagnosis not present

## 2019-08-09 DIAGNOSIS — L820 Inflamed seborrheic keratosis: Secondary | ICD-10-CM | POA: Diagnosis not present

## 2019-08-09 DIAGNOSIS — L700 Acne vulgaris: Secondary | ICD-10-CM | POA: Diagnosis not present

## 2019-08-09 DIAGNOSIS — D485 Neoplasm of uncertain behavior of skin: Secondary | ICD-10-CM | POA: Diagnosis not present

## 2019-11-17 ENCOUNTER — Other Ambulatory Visit: Payer: Self-pay

## 2019-11-17 DIAGNOSIS — Z20822 Contact with and (suspected) exposure to covid-19: Secondary | ICD-10-CM

## 2019-11-18 ENCOUNTER — Other Ambulatory Visit: Payer: PRIVATE HEALTH INSURANCE

## 2019-11-18 LAB — NOVEL CORONAVIRUS, NAA: SARS-CoV-2, NAA: NOT DETECTED

## 2019-12-19 ENCOUNTER — Ambulatory Visit: Payer: PRIVATE HEALTH INSURANCE

## 2019-12-26 ENCOUNTER — Ambulatory Visit: Payer: PRIVATE HEALTH INSURANCE | Attending: Internal Medicine

## 2019-12-26 DIAGNOSIS — Z23 Encounter for immunization: Secondary | ICD-10-CM | POA: Insufficient documentation

## 2019-12-26 NOTE — Progress Notes (Signed)
   Covid-19 Vaccination Clinic  Name:  Catherine Holland    MRN: [id number] DOB: 20-Aug-1946  12/26/2019  [name] was observed post Covid-19 immunization for 15 minutes without incidence. She was provided with Vaccine Information Sheet and instruction to access the V-Safe system.   [name] was instructed to call 911 with any severe reactions post vaccine: Marland Kitchen Difficulty breathing  . Swelling of your face and throat  . A fast heartbeat  . A bad rash all over your body  . Dizziness and weakness    Immunizations Administered    Name Date Dose VIS Date Route   Pfizer COVID-19 Vaccine 12/26/2019  9:47 AM 0.3 mL 10/29/2019 Intramuscular   Manufacturer: [place]   Lot: [id number]   [place]: [id number]

## 2019-12-30 ENCOUNTER — Ambulatory Visit: Payer: PRIVATE HEALTH INSURANCE

## 2020-01-07 DIAGNOSIS — E782 Mixed hyperlipidemia: Secondary | ICD-10-CM | POA: Diagnosis not present

## 2020-01-07 DIAGNOSIS — E559 Vitamin D deficiency, unspecified: Secondary | ICD-10-CM | POA: Diagnosis not present

## 2020-01-07 DIAGNOSIS — Z853 Personal history of malignant neoplasm of breast: Secondary | ICD-10-CM | POA: Diagnosis not present

## 2020-01-07 DIAGNOSIS — G8929 Other chronic pain: Secondary | ICD-10-CM | POA: Diagnosis not present

## 2020-01-07 DIAGNOSIS — Z85820 Personal history of malignant melanoma of skin: Secondary | ICD-10-CM | POA: Diagnosis not present

## 2020-01-07 DIAGNOSIS — Z0001 Encounter for general adult medical examination with abnormal findings: Secondary | ICD-10-CM | POA: Diagnosis not present

## 2020-01-07 DIAGNOSIS — R0989 Other specified symptoms and signs involving the circulatory and respiratory systems: Secondary | ICD-10-CM | POA: Diagnosis not present

## 2020-01-07 DIAGNOSIS — M545 Low back pain: Secondary | ICD-10-CM | POA: Diagnosis not present

## 2020-01-07 DIAGNOSIS — M85852 Other specified disorders of bone density and structure, left thigh: Secondary | ICD-10-CM | POA: Diagnosis not present

## 2020-01-07 DIAGNOSIS — I712 Thoracic aortic aneurysm, without rupture: Secondary | ICD-10-CM | POA: Diagnosis not present

## 2020-01-07 DIAGNOSIS — K5730 Diverticulosis of large intestine without perforation or abscess without bleeding: Secondary | ICD-10-CM | POA: Diagnosis not present

## 2020-01-07 DIAGNOSIS — M7061 Trochanteric bursitis, right hip: Secondary | ICD-10-CM | POA: Diagnosis not present

## 2020-01-19 ENCOUNTER — Ambulatory Visit: Payer: PRIVATE HEALTH INSURANCE | Attending: Internal Medicine

## 2020-01-19 DIAGNOSIS — Z23 Encounter for immunization: Secondary | ICD-10-CM | POA: Insufficient documentation

## 2020-01-19 NOTE — Progress Notes (Signed)
   Covid-19 Vaccination Clinic  Name:  Catherine Holland    MRN: [id number] DOB: 1946/07/29  01/19/2020  [name] was observed post Covid-19 immunization for 15 minutes without incident. She was provided with Vaccine Information Sheet and instruction to access the V-Safe system.   [name] was instructed to call 911 with any severe reactions post vaccine: Marland Kitchen Difficulty breathing  . Swelling of face and throat  . A fast heartbeat  . A bad rash all over body  . Dizziness and weakness   Immunizations Administered    Name Date Dose VIS Date Route   Pfizer COVID-19 Vaccine 01/19/2020  8:37 AM 0.3 mL 10/29/2019 Intramuscular   Manufacturer: [place]   Lot: [id number]   [place]: [id number]

## 2020-04-18 ENCOUNTER — Other Ambulatory Visit: Payer: Self-pay | Admitting: Obstetrics & Gynecology

## 2020-04-18 DIAGNOSIS — Z1231 Encounter for screening mammogram for malignant neoplasm of breast: Secondary | ICD-10-CM

## 2020-07-03 ENCOUNTER — Ambulatory Visit
Admission: RE | Admit: 2020-07-03 | Discharge: 2020-07-03 | Disposition: A | Discharge status: Home / Self Care | Payer: PRIVATE HEALTH INSURANCE | Source: Ambulatory Visit | Attending: Obstetrics & Gynecology | Admitting: Obstetrics & Gynecology

## 2020-07-03 ENCOUNTER — Other Ambulatory Visit: Payer: Self-pay

## 2020-07-03 DIAGNOSIS — Z1231 Encounter for screening mammogram for malignant neoplasm of breast: Secondary | ICD-10-CM

## 2020-07-06 ENCOUNTER — Encounter: Payer: Self-pay | Admitting: Genetic Counselor

## 2020-07-12 ENCOUNTER — Other Ambulatory Visit: Payer: Self-pay | Admitting: Family Medicine

## 2020-07-12 DIAGNOSIS — I712 Thoracic aortic aneurysm, without rupture, unspecified: Secondary | ICD-10-CM

## 2020-07-28 ENCOUNTER — Ambulatory Visit: Payer: PRIVATE HEALTH INSURANCE | Admitting: Cardiology

## 2020-07-28 ENCOUNTER — Telehealth: Payer: Self-pay | Admitting: Cardiology

## 2020-07-28 NOTE — Telephone Encounter (Signed)
Spoke to the patient just now and let her know that if she is willing to see another Dr. Parks Ranger Dr. Geraldo Pitter can see her today at 10 AM. Patient tells me that she is currently at her PCP'S office but would still like to take this appointment. I got he scheduled and she is going to call back if she needs to cancel it.

## 2020-07-28 NOTE — Telephone Encounter (Signed)
Patient's husband called stating he and the patient are on their way to the office due to patient's BP reading 170/90. He wanted to know if  Dr. Agustin Cree is in the office today. I made him aware that Dr. Agustin Cree is not in the office and that am routing a message to clinical staff so a nurse can advise. Patient's husband asked if there is anyone else the patient can see today and I made him aware that none of the other providers will see the patient today as she is an established patient of Dr. Wendy Poet and he would have to approve prior. Patient's husband thanked me and hung up before I could obtain additional BP readings. Please return call to advise.

## 2020-08-02 ENCOUNTER — Other Ambulatory Visit: Payer: Self-pay

## 2020-08-02 ENCOUNTER — Ambulatory Visit
Admission: RE | Admit: 2020-08-02 | Discharge: 2020-08-02 | Disposition: A | Discharge status: Home / Self Care | Payer: PRIVATE HEALTH INSURANCE | Source: Ambulatory Visit | Attending: Family Medicine | Admitting: Family Medicine

## 2020-08-02 DIAGNOSIS — I712 Thoracic aortic aneurysm, without rupture, unspecified: Secondary | ICD-10-CM

## 2020-08-02 MED ORDER — GADOBENATE DIMEGLUMINE 529 MG/ML IV SOLN
13.0000 mL | Freq: Once | INTRAVENOUS | Status: AC | PRN
  Administered 2020-08-02: 09:00:00 13 mL via INTRAVENOUS

## 2020-08-23 ENCOUNTER — Other Ambulatory Visit: Payer: PRIVATE HEALTH INSURANCE

## 2020-08-23 DIAGNOSIS — Z20822 Contact with and (suspected) exposure to covid-19: Secondary | ICD-10-CM

## 2020-08-24 LAB — NOVEL CORONAVIRUS, NAA: SARS-CoV-2, NAA: NOT DETECTED

## 2020-08-24 LAB — SARS-COV-2, NAA 2 DAY TAT

## 2020-11-18 DIAGNOSIS — H269 Unspecified cataract: Secondary | ICD-10-CM

## 2020-11-18 HISTORY — DX: Unspecified cataract: H26.9

## 2020-11-18 HISTORY — PX: CATARACT EXTRACTION, BILATERAL: SHX1313

## 2020-11-27 ENCOUNTER — Other Ambulatory Visit: Payer: Self-pay

## 2020-11-27 DIAGNOSIS — C439 Malignant melanoma of skin, unspecified: Secondary | ICD-10-CM | POA: Insufficient documentation

## 2020-11-27 DIAGNOSIS — I712 Thoracic aortic aneurysm, without rupture, unspecified: Secondary | ICD-10-CM | POA: Insufficient documentation

## 2020-11-27 DIAGNOSIS — C50919 Malignant neoplasm of unspecified site of unspecified female breast: Secondary | ICD-10-CM | POA: Insufficient documentation

## 2020-11-27 DIAGNOSIS — K219 Gastro-esophageal reflux disease without esophagitis: Secondary | ICD-10-CM | POA: Insufficient documentation

## 2020-11-27 DIAGNOSIS — R000 Tachycardia, unspecified: Secondary | ICD-10-CM | POA: Insufficient documentation

## 2020-11-29 ENCOUNTER — Other Ambulatory Visit: Payer: Self-pay

## 2020-11-29 ENCOUNTER — Ambulatory Visit (INDEPENDENT_AMBULATORY_CARE_PROVIDER_SITE_OTHER): Payer: PRIVATE HEALTH INSURANCE | Admitting: Cardiology

## 2020-11-29 ENCOUNTER — Encounter: Payer: Self-pay | Admitting: Cardiology

## 2020-11-29 VITALS — BP 160/70 | HR 78 | Ht 65.0 in | Wt 136.0 lb

## 2020-11-29 DIAGNOSIS — I10 Essential (primary) hypertension: Secondary | ICD-10-CM | POA: Diagnosis not present

## 2020-11-29 DIAGNOSIS — I712 Thoracic aortic aneurysm, without rupture, unspecified: Secondary | ICD-10-CM

## 2020-11-29 DIAGNOSIS — E785 Hyperlipidemia, unspecified: Secondary | ICD-10-CM

## 2020-11-29 NOTE — Progress Notes (Signed)
Cardiology Office Note:    Date:  11/29/2020   ID:  Catherine Holland, DOB 25-Dec-1945, MRN [id number]  PCP:  Cari Caraway, MD  Cardiologist:  Jenne Campus, MD    Referring MD: Cari Caraway, MD   Chief Complaint  Patient presents with  . Discuss med change   I have high blood pressure  History of Present Illness:    Catherine Holland is a 75 y.o. female with past medical history significant for essential hypertension, atypical chest pain, thoracic aortic aneurysm without rupture.  She comes today to our office to discuss her blood pressure that she is concerned about.  She was found to have elevated blood pressure and was given lisinopril 5 mg daily for it.  She is very unhappy about this she said she does not like medications she read all potential side effects of this medication and she said she does not want to take it.  She prefers natural way to manage her blood pressure.  Denies have any chest pain tightness squeezing pressure burning chest no shortness of breath no swelling of lower extremities.  Past Medical History:  Diagnosis Date  . ABNORMAL EKG 11/09/2009   Qualifier: Diagnosis of  By: Haroldine Laws, MD, Eileen Stanford Breast cancer ([place])    --s/p lumpectomy and XRT --Arimdex  . Chest pain, unspecified    --normal myoview in july 2010   . Dyslipidemia 09/22/2018  . Essential hypertension, benign 03/25/2011  . Ganglion cyst of left foot 07/13/2019  . GERD (gastroesophageal reflux disease)   . Melanoma ([place])   . Palpitations 11/09/2009   Qualifier: Diagnosis of  By: Rose Fillers, RN, Heather    . Personal history of radiation therapy 2007  . Tachycardia    -monitor 12/10. Sr with PACs --echo 12/10. EF normal. no signifcant valvular abnormalities. --ETT normal 4/11    . TACHYCARDIA 11/07/2009   Qualifier: Diagnosis of  By: Mare Ferrari, RMA, Sherri    . Thoracic aortic aneurysm ([place])   . Thoracic aortic aneurysm (TAA) ([place]) 01/08/2010   Qualifier: Diagnosis of  By: Adline Potter      Past Surgical History:  Procedure Laterality Date  . BREAST BIOPSY Right 2007  . BREAST LUMPECTOMY Right 2007  . Cyst removal from L mid toe    . INGUINAL HERNIA REPAIR    . MASS EXCISION Right 03/25/2017   Procedure: EXCISION MUCOID CYST WITH DISTAL PHANGEAL JOINT ARTHROTOMY RIGHT INDEX FINGER;  Surgeon: Daryll Brod, MD;  Location: [facility];  Service: Orthopedics;  Laterality: Right;  REG/FAB  . Right heel    . TUBAL LIGATION      Current Medications: Current Meds  Medication Sig  . Ascorbic Acid (VITAMIN C PO) Take by mouth.  Marland Kitchen aspirin 81 MG EC tablet Take 81 mg by mouth daily.  . B Complex Vitamins (B COMPLEX 1 PO) Take 1 tablet by mouth daily.  . cholecalciferol (VITAMIN D) 1000 UNITS tablet Take 2 tabs once daily.  Marland Kitchen lisinopril (ZESTRIL) 5 MG tablet Take 5 mg by mouth daily.  . Multiple Vitamin (MULTIVITAMIN) tablet Take 1 tablet by mouth daily.  Marland Kitchen tretinoin (RETIN-A) 0.025 % cream Apply 1 application topically as needed.     Allergies:   Adhesive [tape], Meperidine hcl, Meperidine hcl, and Other   Social History   Socioeconomic History  . Marital status: Married    Spouse name: Not on file  . Number of children: 2  . Years of education: Not on  file  . Highest education level: Not on file  Occupational History  . 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 Smoking status: Never Smoker  . Smokeless tobacco: Never Used  Vaping Use  . Vaping Use: Never used  Substance and Sexual Activity  . Alcohol use: Yes    Comment: Half a glass of red wine but not everyday.  . Drug use: No  . Sexual activity: Not on file  Other Topics Concern  . Not on file  Social History Narrative  . Not on file   Social Determinants of Health   Financial Resource Strain: Not on file  Food Insecurity: Not on file  Transportation Needs: Not on file  Physical Activity: Not on file  Stress: Not on  file  Social Connections: Not on file     Family History: The patient's family history includes Angina in an other family member; Diabetes in her father; Heart attack in her mother; Heart disease in her brother and brother; Hypertension in her mother and another family member; Liver disease in an other family member; Stroke in an other family member. There is no history of Breast cancer. ROS:   Please see the history of present illness.    All 14 point review of systems negative except as described per history of present illness  EKGs/Labs/Other Studies Reviewed:      Recent Labs: No results found for requested labs within last 8760 hours.  Recent Lipid Panel    Component Value Date/Time   CHOL (H) 06/02/2009 0108    205        ATP III CLASSIFICATION:  <200     mg/dL   Desirable  169-678  mg/dL   Borderline High  >=938    mg/dL   High          TRIG 101 06/02/2009 0108   HDL 65 06/02/2009 0108   CHOLHDL 3.2 06/02/2009 0108   VLDL 23 06/02/2009 0108   LDLCALC (H) 06/02/2009 0108    117        Total Cholesterol/HDL:CHD Risk Coronary Heart Disease Risk Table                     Men   Women  1/2 Average Risk   3.4   3.3  Average Risk       5.0   4.4  2 X Average Risk   9.6   7.1  3 X Average Risk  23.4   11.0        Use the calculated Patient Ratio above and the CHD Risk Table to determine the patient's CHD Risk.        ATP III CLASSIFICATION (LDL):  <100     mg/dL   Optimal  751-025  mg/dL   Near or Above                    Optimal  130-159  mg/dL   Borderline  852-778  mg/dL   High  >242     mg/dL   Very High    Physical Exam:    VS:  BP (!) 160/70 (BP Location: Left Arm, Patient Position: Sitting)   Pulse 78   Ht 5\' 5"  (1.651 m)   Wt 136 lb (61.7 kg)   SpO2 98%   BMI 22.63 kg/m     Wt Readings from Last 3 Encounters:  11/29/20 136 lb (61.7 kg)  10/14/18 156 lb 1.9 oz (70.8 kg)  09/22/18 154 lb 12.8 oz (70.2 kg)     GEN:  Well nourished, well  developed in no acute distress HEENT: Normal NECK: No JVD; No carotid bruits LYMPHATICS: No lymphadenopathy CARDIAC: RRR, no murmurs, no rubs, no gallops RESPIRATORY:  Clear to auscultation without rales, wheezing or rhonchi  ABDOMEN: Soft, non-tender, non-distended MUSCULOSKELETAL:  No edema; No deformity  SKIN: Warm and dry LOWER EXTREMITIES: no swelling NEUROLOGIC:  Alert and oriented x 3 PSYCHIATRIC:  Normal affect   ASSESSMENT:    1. Essential hypertension, benign   2. Thoracic aortic aneurysm without rupture ([place])   3. Dyslipidemia    PLAN:    In order of problems listed above:  1. Essential hypertension.  She is very reluctant to take any medications.  We discussed nonpharmacological options to improve her blood pressure which is avoiding salty food exercising on the regular basis and weight loss.  She said she will try to do that.  Also ask her to check blood pressure on the regular basis while at home and documented I want her to bring it back to my office when she be here next time for drop it by my office in about 2 weeks.  I asked her to hold with lisinopril right now since she is so uncomfortable taking it I will check a Chem-7 today make sure her kidney function is still intact.  Then if her blood pressure will be elevated within the next 2 weeks then we will reinitiate therapy with this medication may be if she prefers to switch her to a different class of medications.  I am doing this especially in view of the fact that she does have mild enlargement of the thoracic aorta. 2. Dyslipidemia we did review her cholesterol her LDL is 136 HDL 74.  Calculated risk is intermediate class.  We will continue monitoring the situation. 3. Thoracic aneurysm.  We will do echocardiogram to reassess.   Medication Adjustments/Labs and Tests Ordered: Current medicines are reviewed at length with the patient today.  Concerns regarding medicines are outlined above.  Orders Placed This  Encounter  Procedures  . EKG 12-Lead  . ECHOCARDIOGRAM COMPLETE   Medication changes: No orders of the defined types were placed in this encounter.   Signed, Park Liter, MD, [facility] 11/29/2020 5:16 PM    [place]

## 2020-11-29 NOTE — Patient Instructions (Signed)
Medication Instructions:  Your physician recommends that you continue on your current medications as directed. Please refer to the Current Medication list given to you today.  *If you need a refill on your cardiac medications before your next appointment, please call your pharmacy*  Lab Work: None ordered today  Testing/Procedures: Your physician has requested that you have an echocardiogram. Echocardiography is a painless test that uses sound waves to create images of your heart. It provides your doctor with information about the size and shape of your heart and how well your heart's chambers and valves are working. This procedure takes approximately one hour. There are no restrictions for this procedure.  Follow-Up: At [facility], you and your health needs are our priority.  As part of our continuing mission to provide you with exceptional heart care, we have created designated Provider Care Teams.  These Care Teams include your primary Cardiologist (physician) and Advanced Practice Providers (APPs -  Physician Assistants and Nurse Practitioners) who all work together to provide you with the care you need, when you need it.  Your next appointment:   6 week(s)  The format for your next appointment:   In Person  Provider:   Jenne Campus, MD

## 2020-12-19 ENCOUNTER — Telehealth: Payer: Self-pay | Admitting: Emergency Medicine

## 2020-12-19 NOTE — Telephone Encounter (Signed)
Called patient. Informed her per Dr. Agustin Cree she should continue keeping a record of her blood pressure and we will decide on medication therapy in the future. Patient will continue nonpharmacologic interventions for now. She verbally understood. She will let us know if she needs anything further.

## 2020-12-22 ENCOUNTER — Ambulatory Visit (HOSPITAL_BASED_OUTPATIENT_CLINIC_OR_DEPARTMENT_OTHER)
Admission: RE | Admit: 2020-12-22 | Discharge: 2020-12-22 | Disposition: A | Discharge status: Home / Self Care | Payer: PRIVATE HEALTH INSURANCE | Source: Ambulatory Visit | Attending: Cardiology | Admitting: Cardiology

## 2020-12-22 ENCOUNTER — Other Ambulatory Visit: Payer: Self-pay

## 2020-12-22 DIAGNOSIS — I10 Essential (primary) hypertension: Secondary | ICD-10-CM | POA: Diagnosis not present

## 2020-12-22 LAB — ECHOCARDIOGRAM COMPLETE
Area-P 1/2: 2.91 cm2
S' Lateral: 3.17 cm

## 2020-12-27 ENCOUNTER — Telehealth: Payer: Self-pay

## 2020-12-27 NOTE — Telephone Encounter (Signed)
Patient notified of results and verbalized understanding.  

## 2020-12-27 NOTE — Telephone Encounter (Signed)
-----   Message from Park Liter, MD sent at 12/26/2020  4:30 PM EST ----- Echocardiogram showed preserved left ventricle ejection fraction, mild mitral valve regurgitation no concern, overall looks good

## 2021-01-26 ENCOUNTER — Ambulatory Visit: Payer: PRIVATE HEALTH INSURANCE | Admitting: Cardiology

## 2021-01-26 DIAGNOSIS — K921 Melena: Secondary | ICD-10-CM

## 2021-01-26 DIAGNOSIS — K5730 Diverticulosis of large intestine without perforation or abscess without bleeding: Secondary | ICD-10-CM

## 2021-01-26 DIAGNOSIS — M85852 Other specified disorders of bone density and structure, left thigh: Secondary | ICD-10-CM

## 2021-01-26 DIAGNOSIS — Z0000 Encounter for general adult medical examination without abnormal findings: Secondary | ICD-10-CM

## 2021-01-26 DIAGNOSIS — K5900 Constipation, unspecified: Secondary | ICD-10-CM

## 2021-01-26 DIAGNOSIS — E559 Vitamin D deficiency, unspecified: Secondary | ICD-10-CM

## 2021-01-26 DIAGNOSIS — G8929 Other chronic pain: Secondary | ICD-10-CM

## 2021-01-26 DIAGNOSIS — Z853 Personal history of malignant neoplasm of breast: Secondary | ICD-10-CM

## 2021-01-26 DIAGNOSIS — F411 Generalized anxiety disorder: Secondary | ICD-10-CM

## 2021-01-26 DIAGNOSIS — E782 Mixed hyperlipidemia: Secondary | ICD-10-CM

## 2021-01-26 DIAGNOSIS — I10 Essential (primary) hypertension: Secondary | ICD-10-CM | POA: Insufficient documentation

## 2021-01-26 DIAGNOSIS — Z85820 Personal history of malignant melanoma of skin: Secondary | ICD-10-CM

## 2021-01-26 HISTORY — DX: Generalized anxiety disorder: F41.1

## 2021-01-26 HISTORY — DX: Mixed hyperlipidemia: E78.2

## 2021-01-26 HISTORY — DX: Personal history of malignant melanoma of skin: Z85.820

## 2021-01-26 HISTORY — DX: Other specified disorders of bone density and structure, left thigh: M85.852

## 2021-01-26 HISTORY — DX: Vitamin D deficiency, unspecified: E55.9

## 2021-01-26 HISTORY — DX: Personal history of malignant neoplasm of breast: Z85.3

## 2021-01-26 HISTORY — DX: Diverticulosis of large intestine without perforation or abscess without bleeding: K57.30

## 2021-01-26 HISTORY — DX: Encounter for general adult medical examination without abnormal findings: Z00.00

## 2021-01-26 HISTORY — DX: Constipation, unspecified: K59.00

## 2021-01-26 HISTORY — DX: Melena: K92.1

## 2021-01-26 HISTORY — DX: Other chronic pain: G89.29

## 2021-01-31 ENCOUNTER — Other Ambulatory Visit: Payer: Self-pay

## 2021-01-31 ENCOUNTER — Encounter: Payer: Self-pay | Admitting: Cardiology

## 2021-01-31 ENCOUNTER — Ambulatory Visit (INDEPENDENT_AMBULATORY_CARE_PROVIDER_SITE_OTHER): Payer: PRIVATE HEALTH INSURANCE | Admitting: Cardiology

## 2021-01-31 VITALS — BP 134/82 | HR 68 | Ht 65.0 in | Wt 136.0 lb

## 2021-01-31 DIAGNOSIS — I10 Essential (primary) hypertension: Secondary | ICD-10-CM

## 2021-01-31 DIAGNOSIS — I712 Thoracic aortic aneurysm, without rupture, unspecified: Secondary | ICD-10-CM

## 2021-01-31 DIAGNOSIS — E785 Hyperlipidemia, unspecified: Secondary | ICD-10-CM | POA: Diagnosis not present

## 2021-01-31 MED ORDER — LOSARTAN POTASSIUM 25 MG PO TABS: 25.0000 mg | ORAL_TABLET | Freq: Every day | ORAL | 3 refills | Status: AC

## 2021-01-31 NOTE — Addendum Note (Signed)
Addended by: Orvan July on: 01/31/2021 05:00 PM   Modules accepted: Orders

## 2021-01-31 NOTE — Patient Instructions (Addendum)
Medication Instructions: Your physician has recommended you make the following change in your medication:  START: Losartan 25 mg daily *If you need a refill on your cardiac medications before your next appointment, please call your pharmacy*   Lab Work: Your physician recommends that you return for lab work: NEXT WEEK: BMP  If you have labs (blood work) drawn today and your tests are completely normal, you will receive your results only by: Marland Kitchen MyChart Message (if you have MyChart) OR . A paper copy in the mail If you have any lab test that is abnormal or we need to change your treatment, we will call you to review the results.   Testing/Procedures: None   Follow-Up: At [facility], you and your health needs are our priority.  As part of our continuing mission to provide you with exceptional heart care, we have created designated Provider Care Teams.  These Care Teams include your primary Cardiologist (physician) and Advanced Practice Providers (APPs -  Physician Assistants and Nurse Practitioners) who all work together to provide you with the care you need, when you need it.  We recommend signing up for the patient portal called "MyChart".  Sign up information is provided on this After Visit Summary.  MyChart is used to connect with patients for Virtual Visits (Telemedicine).  Patients are able to view lab/test results, encounter notes, upcoming appointments, etc.  Non-urgent messages can be sent to your provider as well.   To learn more about what you can do with MyChart, go to [email or web address].    Your next appointment:   3 month(s)  The format for your next appointment:   In Person  Provider:   Jenne Campus, MD   Other Instructions

## 2021-01-31 NOTE — Progress Notes (Signed)
Cardiology Office Note:    Date:  01/31/2021   ID:  Catherine Holland, DOB 10/21/1946, MRN [id number]  PCP:  Cari Caraway, MD  Cardiologist:  Jenne Campus, MD    Referring MD: Cari Caraway, MD   Chief Complaint  Patient presents with  . Results  . Medication Management    History of Present Illness:    Catherine Holland is a 75 y.o. female with past medical history significant for essential hypertension, descending thoracic aortic aneurysm measuring 36 mm based on MRI from September 2021, unchanged from prior study in 2008, dyslipidemia.  She comes today to my office discuss her high blood pressure.  Overall she is doing well denies any chest pain tightness squeezing pressure burning chest.  She did not take any medication for her blood pressure she was checking her blood pressure on the regular basis sadly her blood pressure still elevated to the point of treatment need to be initiated.  Previously she was given Zestril but she was concerned about side effects.  She was reluctant to continue taking this medication on top of that there is some data which is not too strong for ARB rather than ACE inhibitor.  Therefore we decided to try losartan 25 mg daily to see if that helps with her blood pressure.  We did talk about need to exercise on the regular basis which she does already  Past Medical History:  Diagnosis Date  . ABNORMAL EKG 11/09/2009   Qualifier: Diagnosis of  By: Haroldine Laws, MD, Eileen Stanford Breast cancer ([place])    --s/p lumpectomy and XRT --Arimdex  . Chest pain, unspecified    --normal myoview in july 2010   . Chronic pain 01/26/2021  . Constipation 01/26/2021  . Diverticular disease of colon 01/26/2021  . Dyslipidemia 09/22/2018  . Encounter for general adult medical examination without abnormal findings 01/26/2021  . Essential hypertension, benign 03/25/2011  . Ganglion cyst of left foot 07/13/2019  . Generalized anxiety disorder 01/26/2021  . GERD (gastroesophageal  reflux disease)   . Hematochezia 01/26/2021  . Melanoma ([place])   . Mixed hyperlipidemia 01/26/2021  . Osteopenia of left thigh 01/26/2021  . Palpitations 11/09/2009   Qualifier: Diagnosis of  By: Rose Fillers, RN, Heather    . Personal history of malignant melanoma of skin 01/26/2021  . Personal history of malignant neoplasm of breast 01/26/2021  . Personal history of radiation therapy 2007  . Tachycardia    -monitor 12/10. Sr with PACs --echo 12/10. EF normal. no signifcant valvular abnormalities. --ETT normal 4/11    . TACHYCARDIA 11/07/2009   Qualifier: Diagnosis of  By: Mare Ferrari, RMA, Sherri    . Thoracic aortic aneurysm ([place])   . Thoracic aortic aneurysm (TAA) ([place]) 01/08/2010   Qualifier: Diagnosis of  By: Rose Fillers, RN, Heather    . Vitamin D deficiency 01/26/2021    Past Surgical History:  Procedure Laterality Date  . BREAST BIOPSY Right 2007  . BREAST LUMPECTOMY Right 2007  . Cyst removal from L mid toe    . INGUINAL HERNIA REPAIR    . MASS EXCISION Right 03/25/2017   Procedure: EXCISION MUCOID CYST WITH DISTAL PHANGEAL JOINT ARTHROTOMY RIGHT INDEX FINGER;  Surgeon: Daryll Brod, MD;  Location: [facility];  Service: Orthopedics;  Laterality: Right;  REG/FAB  . Right heel    . TUBAL LIGATION      Current Medications: Current Meds  Medication Sig  . Ascorbic Acid (VITAMIN C PO) Take by mouth.  Marland Kitchen  aspirin 81 MG EC tablet Take 81 mg by mouth daily.  . B Complex Vitamins (B COMPLEX 1 PO) Take 1 tablet by mouth daily.  . cholecalciferol (VITAMIN D) 1000 UNITS tablet Take 2 tabs once daily.  Marland Kitchen lisinopril (ZESTRIL) 5 MG tablet Take 5 mg by mouth daily.  . Multiple Vitamin (MULTIVITAMIN) tablet Take 1 tablet by mouth daily.  Marland Kitchen tretinoin (RETIN-A) 0.025 % cream Apply 1 application topically as needed ([place]).     Allergies:   Adhesive [tape], Meperidine hcl, Meperidine hcl, and Other   Social History   Socioeconomic History  . Marital status: Married    Spouse name: Not on file   . Number of children: 2  . Years of education: Not on file  . Highest education level: Not on file  Occupational History  . 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 Smoking status: Never Smoker  . Smokeless tobacco: Never Used  Vaping Use  . Vaping Use: Never used  Substance and Sexual Activity  . Alcohol use: Yes    Comment: Half a glass of red wine but not everyday.  . Drug use: No  . Sexual activity: Not on file  Other Topics Concern  . Not on file  Social History Narrative  . Not on file   Social Determinants of Health   Financial Resource Strain: Not on file  Food Insecurity: Not on file  Transportation Needs: Not on file  Physical Activity: Not on file  Stress: Not on file  Social Connections: Not on file     Family History: The patient's family history includes Angina in an other family member; Diabetes in her father; Heart attack in her mother; Heart disease in her brother and brother; Hypertension in her mother and another family member; Liver disease in an other family member; Stroke in an other family member. There is no history of Breast cancer. ROS:   Please see the history of present illness.    All 14 point review of systems negative except as described per history of present illness  EKGs/Labs/Other Studies Reviewed:      Recent Labs: No results found for requested labs within last 8760 hours.  Recent Lipid Panel    Component Value Date/Time   CHOL (H) 06/02/2009 0108    205        ATP III CLASSIFICATION:  <200     mg/dL   Desirable  200-239  mg/dL   Borderline High  >=240    mg/dL   High          TRIG 116 06/02/2009 0108   HDL 65 06/02/2009 0108   CHOLHDL 3.2 06/02/2009 0108   VLDL 23 06/02/2009 0108   LDLCALC (H) 06/02/2009 0108    117        Total Cholesterol/HDL:CHD Risk Coronary Heart Disease Risk Table                     Men   Women  1/2 Average Risk   3.4   3.3  Average  Risk       5.0   4.4  2 X Average Risk   9.6   7.1  3 X Average Risk  23.4   11.0        Use the calculated Patient Ratio above and the CHD Risk Table to determine the patient's CHD Risk.        ATP III CLASSIFICATION (  LDL):  <100     mg/dL   Optimal  100-129  mg/dL   Near or Above                    Optimal  130-159  mg/dL   Borderline  160-189  mg/dL   High  >190     mg/dL   Very High    Physical Exam:    VS:  BP 134/82 (BP Location: Right Arm, Patient Position: Sitting)   Pulse 68   Ht 5\' 5"  (1.651 m)   Wt 136 lb (61.7 kg)   SpO2 98%   BMI 22.63 kg/m     Wt Readings from Last 3 Encounters:  01/31/21 136 lb (61.7 kg)  11/29/20 136 lb (61.7 kg)  10/14/18 156 lb 1.9 oz (70.8 kg)     GEN:  Well nourished, well developed in no acute distress HEENT: Normal NECK: No JVD; No carotid bruits LYMPHATICS: No lymphadenopathy CARDIAC: RRR, no murmurs, no rubs, no gallops RESPIRATORY:  Clear to auscultation without rales, wheezing or rhonchi  ABDOMEN: Soft, non-tender, non-distended MUSCULOSKELETAL:  No edema; No deformity  SKIN: Warm and dry LOWER EXTREMITIES: no swelling NEUROLOGIC:  Alert and oriented x 3 PSYCHIATRIC:  Normal affect   ASSESSMENT:    1. Thoracic aortic aneurysm without rupture ([place])   2. Essential hypertension, benign   3. Dyslipidemia    PLAN:    In order of problems listed above:  1. Thoracic aortic aneurysm which is proximal portion of the thoracic descending aorta only 36 mm.  We will continue monitoring. 2. Essential hypertension I will initiate losartan week later we will check a Chem-7. 3. Dyslipidemia: Exercises and diet for now her LDL 136 HDL 74.  We will recheck it within the next few months and then decide about potential treatment.   Medication Adjustments/Labs and Tests Ordered: Current medicines are reviewed at length with the patient today.  Concerns regarding medicines are outlined above.  No orders of the defined types were  placed in this encounter.  Medication changes: No orders of the defined types were placed in this encounter.   Signed, Park Liter, MD, [facility] 01/31/2021 [facility]

## 2021-02-04 ENCOUNTER — Telehealth: Payer: Self-pay | Admitting: Physician Assistant

## 2021-02-04 NOTE — Telephone Encounter (Signed)
    Patient paged answering service with concerns regarding BP after starting losartan 25 mg. She began the medication on 3/17 following her visit on 3/16 with first several BPs ranging from the 120s to 589U mmHg systolic. On 1/19, her BP improved to the 110s to low 834F systolic with medication. Again this morning, prior to taking medication her BP is elevated in the 583E systolic. She does not feel like she is tolerating the medication, noting posterior headache, left lower back pain, and chest discomfort. These symptoms did not start until she started the medication and are only noted in the evening when she lays down to rest and is without distractions. Currently, chest pain free. She was given ED precautions. We have agreed to have her take a lower dose losartan 12.5 mg daily today to see if this helps with tolerating the medication. I advised her I would route this note to her cardiologist for further management.

## 2021-03-26 ENCOUNTER — Telehealth: Payer: Self-pay | Admitting: Emergency Medicine

## 2021-03-26 NOTE — Telephone Encounter (Signed)
Called patient to follow up on blood pressure readings she dropped off here. Dr. Agustin Cree said she can restart her lisinopril 5 mg daily if she would like. Left message for patient to return call.

## 2021-04-27 ENCOUNTER — Other Ambulatory Visit: Payer: Self-pay | Admitting: Obstetrics & Gynecology

## 2021-04-27 DIAGNOSIS — Z1231 Encounter for screening mammogram for malignant neoplasm of breast: Secondary | ICD-10-CM

## 2021-05-01 ENCOUNTER — Ambulatory Visit: Payer: PRIVATE HEALTH INSURANCE | Admitting: Cardiology

## 2021-07-03 ENCOUNTER — Emergency Department (HOSPITAL_COMMUNITY)
Admission: EM | Admit: 2021-07-03 | Discharge: 2021-07-03 | Disposition: A | Discharge status: Home / Self Care | Payer: PRIVATE HEALTH INSURANCE | Attending: Emergency Medicine | Admitting: Emergency Medicine

## 2021-07-03 ENCOUNTER — Other Ambulatory Visit: Payer: Self-pay

## 2021-07-03 ENCOUNTER — Emergency Department (HOSPITAL_COMMUNITY): Payer: PRIVATE HEALTH INSURANCE

## 2021-07-03 ENCOUNTER — Encounter (HOSPITAL_COMMUNITY): Payer: Self-pay

## 2021-07-03 DIAGNOSIS — Z853 Personal history of malignant neoplasm of breast: Secondary | ICD-10-CM | POA: Insufficient documentation

## 2021-07-03 DIAGNOSIS — Z79899 Other long term (current) drug therapy: Secondary | ICD-10-CM | POA: Insufficient documentation

## 2021-07-03 DIAGNOSIS — I10 Essential (primary) hypertension: Secondary | ICD-10-CM | POA: Diagnosis not present

## 2021-07-03 DIAGNOSIS — Z7982 Long term (current) use of aspirin: Secondary | ICD-10-CM | POA: Diagnosis not present

## 2021-07-03 DIAGNOSIS — R072 Precordial pain: Secondary | ICD-10-CM | POA: Insufficient documentation

## 2021-07-03 DIAGNOSIS — Z85820 Personal history of malignant melanoma of skin: Secondary | ICD-10-CM | POA: Insufficient documentation

## 2021-07-03 LAB — BASIC METABOLIC PANEL
Anion gap: 8 (ref 5–15)
BUN: 22 mg/dL (ref 8–23)
CO2: 28 mmol/L (ref 22–32)
Calcium: 9.1 mg/dL (ref 8.9–10.3)
Chloride: 102 mmol/L (ref 98–111)
Creatinine, Ser: 0.6 mg/dL (ref 0.44–1.00)
GFR, Estimated: >60 mL/min (ref >60)
Glucose, Bld: 94 mg/dL (ref 70–99)
Potassium: 4.3 mmol/L (ref 3.5–5.1)
Sodium: 138 mmol/L (ref 135–145)

## 2021-07-03 LAB — CBC WITH DIFFERENTIAL/PLATELET
Abs Immature Granulocytes: 0.02 10*3/uL (ref 0.00–0.07)
Basophils Absolute: 0.1 10*3/uL (ref 0.0–0.1)
Basophils Relative: 1 %
Eosinophils Absolute: 0.2 10*3/uL (ref 0.0–0.5)
Eosinophils Relative: 3 %
HCT: 41.5 % (ref 36.0–46.0)
Hemoglobin: 13.5 g/dL (ref 12.0–15.0)
Immature Granulocytes: 0 %
Lymphocytes Relative: 34 %
Lymphs Abs: 2.3 10*3/uL (ref 0.7–4.0)
MCH: 31 pg (ref 26.0–34.0)
MCHC: 32.5 g/dL (ref 30.0–36.0)
MCV: 95.4 fL (ref 80.0–100.0)
Monocytes Absolute: 0.4 10*3/uL (ref 0.1–1.0)
Monocytes Relative: 6 %
Neutro Abs: 3.8 10*3/uL (ref 1.7–7.7)
Neutrophils Relative %: 56 %
Platelets: 292 10*3/uL (ref 150–400)
RBC: 4.35 MIL/uL (ref 3.87–5.11)
RDW: 13.2 % (ref 11.5–15.5)
WBC: 6.8 10*3/uL (ref 4.0–10.5)
nRBC: 0 % (ref 0.0–0.2)

## 2021-07-03 LAB — TROPONIN I (HIGH SENSITIVITY)
Troponin I (High Sensitivity): 4 ng/L (ref <18)
Troponin I (High Sensitivity): 3 ng/L (ref <18)

## 2021-07-03 NOTE — ED Provider Notes (Signed)
Emergency Medicine Provider Triage Evaluation Note  Catherine Holland , a 75 y.o. female  was evaluated in triage.  Pt complains of chest pain.  Patient states that she was sitting at her computer when she developed left-sided chest discomfort that was nonradiating.  This is overall improved and is fairly resolved at present, but she decided she should come get checked out.  She denies any associated nausea, vomiting, diaphoresis, or syncope.  Denies history of CAD.Marland Kitchen  Review of Systems  Positive: Chest pain Negative: Shortness of breath, nausea, vomiting, diaphoresis, syncope  Physical Exam  BP (!) 162/94 (BP Location: Left Arm)   Pulse (!) 55   Temp 97.8 F (36.6 C) (Oral)   Resp 16   SpO2 100%  Gen:   Awake, no distress   Resp:  Normal effort  MSK:   Moves extremities without difficulty   Medical Decision Making  Medically screening exam initiated at 4:12 PM.  Appropriate orders placed.  Catherine Holland was informed that the remainder of the evaluation will be completed by another provider, this initial triage assessment does not replace that evaluation, and the importance of remaining in the ED until their evaluation is complete.  Chest pain   [name], PA-C 07/03/21 [place], Ankit, MD 07/03/21 1724

## 2021-07-03 NOTE — ED Provider Notes (Signed)
[place] DEPT Provider Note   CSN: [id number] Arrival date & time: 07/03/21  1535     History Chief Complaint  Patient presents with   Chest Pain    Catherine Holland is a 75 y.o. female.  Patient with history of hypertension, known stable thoracic descending aortic aneurysm, history of breast cancer -- presents to the emergency department today for evaluation of chest pain.  Patient states that around 3 PM today she was sitting at a computer.  She developed pain in her left lateral anterior chest.  This was dull in nature.  Later on she felt a little bit of pain in her left upper back.  She also has a discomfort in her left arm.  Patient has been exercising as recently as last night and has not had any symptoms or decreased exercise tolerance.  Symptoms occurred at rest today.  No associated nausea or vomiting, diaphoresis.  Patient has a family history of heart disease in brothers.  She does not use tobacco.  Symptoms have been improving.  Patient denies risk factors for pulmonary embolism including: unilateral leg swelling, history of DVT/PE/other blood clots, use of exogenous hormones, recent immobilizations, recent surgery, recent travel (>4hr segment), active malignancy, hemoptysis.        Past Medical History:  Diagnosis Date   ABNORMAL EKG 11/09/2009   Qualifier: Diagnosis of  By: Haroldine Laws, MD, Eileen Stanford    Breast cancer [facility])    --s/p lumpectomy and XRT --Arimdex   Chest pain, unspecified    --normal myoview in july 2010    Chronic pain 01/26/2021   Constipation 01/26/2021   Diverticular disease of colon 01/26/2021   Dyslipidemia 09/22/2018   Encounter for general adult medical examination without abnormal findings 01/26/2021   Essential hypertension, benign 03/25/2011   Ganglion cyst of left foot 07/13/2019   Generalized anxiety disorder 01/26/2021   GERD (gastroesophageal reflux disease)    Hematochezia 01/26/2021   Melanoma ([place])    Mixed  hyperlipidemia 01/26/2021   Osteopenia of left thigh 01/26/2021   Palpitations 11/09/2009   Qualifier: Diagnosis of  By: Rose Fillers, RN, Heather     Personal history of malignant melanoma of skin 01/26/2021   Personal history of malignant neoplasm of breast 01/26/2021   Personal history of radiation therapy 2007   Tachycardia    -monitor 12/10. Sr with PACs --echo 12/10. EF normal. no signifcant valvular abnormalities. --ETT normal 4/11     TACHYCARDIA 11/07/2009   Qualifier: Diagnosis of  By: Mare Ferrari, RMA, Sherri     Thoracic aortic aneurysm [facility])    Thoracic aortic aneurysm (TAA) ([place]) 01/08/2010   Qualifier: Diagnosis of  By: Rose Fillers, RN, Heather     Vitamin D deficiency 01/26/2021    Patient Active Problem List   Diagnosis Date Noted   Chronic pain 01/26/2021   Constipation 01/26/2021   Diverticular disease of colon 01/26/2021   Encounter for general adult medical examination without abnormal findings 01/26/2021   Generalized anxiety disorder 01/26/2021   Hematochezia 01/26/2021   Hypertension 01/26/2021   Mixed hyperlipidemia 01/26/2021   Osteopenia of left thigh 01/26/2021   Personal history of malignant melanoma of skin 01/26/2021   Personal history of malignant neoplasm of breast 01/26/2021   Vitamin D deficiency 01/26/2021   Thoracic aortic aneurysm ([facility])    Tachycardia    Melanoma ([place])    GERD (gastroesophageal reflux disease)    Breast cancer ([place])    Ganglion cyst of left foot 07/13/2019  Dyslipidemia 09/22/2018   Essential hypertension, benign 03/25/2011   Thoracic aortic aneurysm (TAA) ([place]) 01/08/2010   PALPITATIONS 11/09/2009   ABNORMAL EKG 11/09/2009   TACHYCARDIA 11/07/2009   CHEST PAIN-UNSPECIFIED 11/07/2009   Personal history of radiation therapy 2007    Past Surgical History:  Procedure Laterality Date   BREAST BIOPSY Right 2007   BREAST LUMPECTOMY Right 2007   Cyst removal from L mid toe     INGUINAL HERNIA REPAIR     MASS EXCISION Right 03/25/2017    Procedure: EXCISION MUCOID CYST WITH DISTAL PHANGEAL JOINT ARTHROTOMY RIGHT INDEX FINGER;  Surgeon: Daryll Brod, MD;  Location: [facility];  Service: Orthopedics;  Laterality: Right;  REG/FAB   Right heel     TUBAL LIGATION       OB History   No obstetric history on file.     Family History  Problem Relation Age of Onset   Angina Other    Stroke Other    Hypertension Other    Liver disease Other    Hypertension Mother    Heart attack Mother    Diabetes Father    Heart disease Brother    Heart disease Brother    Breast cancer Neg Hx     Social History   Tobacco Use   Smoking status: Never   Smokeless tobacco: Never  Vaping Use   Vaping Use: Never used  Substance Use Topics   Alcohol use: Yes    Comment: Half a glass of red wine but not everyday.   Drug use: No    Home Medications Prior to Admission medications   Medication Sig Start Date End Date Taking? Authorizing Provider  Ascorbic Acid (VITAMIN C PO) Take by mouth.    [provider]  aspirin 81 MG EC tablet Take 81 mg by mouth daily.    [provider]  B Complex Vitamins (B COMPLEX 1 PO) Take 1 tablet by mouth daily.    [provider]  cholecalciferol (VITAMIN D) 1000 UNITS tablet Take 2 tabs once daily.    [provider]  lisinopril (ZESTRIL) 5 MG tablet Take 5 mg by mouth daily. 07/28/20   [provider]  losartan (COZAAR) 25 MG tablet Take 1 tablet (25 mg total) by mouth daily. 01/31/21 05/01/21  Park Liter, MD  Multiple Vitamin (MULTIVITAMIN) tablet Take 1 tablet by mouth daily.    [provider]  tretinoin (RETIN-A) 0.025 % cream Apply 1 application topically as needed (Acne).    [provider]    Allergies    Adhesive [tape], Meperidine hcl, Meperidine hcl, and Other  Review of Systems   Review of Systems  Constitutional:  Negative for diaphoresis and fever.  Eyes:  Negative for redness.  Respiratory:  Negative  for cough and shortness of breath.   Cardiovascular:  Positive for chest pain. Negative for palpitations and leg swelling.  Gastrointestinal:  Negative for abdominal pain, nausea and vomiting.  Genitourinary:  Negative for dysuria.  Musculoskeletal:  Positive for back pain. Negative for neck pain.  Skin:  Negative for rash.  Neurological:  Negative for syncope and light-headedness.  Psychiatric/Behavioral:  The patient is not nervous/anxious.    Physical Exam Updated Vital Signs BP (!) 150/82   Pulse (!) 59   Temp 98.5 F (36.9 C) (Oral)   Resp 16   SpO2 100%   Physical Exam Vitals and nursing note reviewed.  Constitutional:      Appearance: She is well-developed.  She is not diaphoretic.  HENT:     Head: Normocephalic and atraumatic.     Mouth/Throat:     Mouth: Mucous membranes are not dry.  Eyes:     Conjunctiva/sclera: Conjunctivae normal.  Neck:     Vascular: Normal carotid pulses. No JVD.     Trachea: Trachea normal. No tracheal deviation.  Cardiovascular:     Rate and Rhythm: Normal rate and regular rhythm.     Pulses: No decreased pulses.          Radial pulses are 2+ on the right side and 2+ on the left side.     Heart sounds: Normal heart sounds, S1 normal and S2 normal. No murmur heard. Pulmonary:     Effort: Pulmonary effort is normal. No respiratory distress.     Breath sounds: No wheezing.  Chest:     Chest wall: No tenderness.     Comments: Focal area on the left lateral chest that she states feels good when I press directly over that area. Abdominal:     General: Bowel sounds are normal.     Palpations: Abdomen is soft.     Tenderness: There is no abdominal tenderness. There is no guarding or rebound.  Musculoskeletal:        General: Normal range of motion.     Cervical back: Normal range of motion and neck supple. No muscular tenderness.  Skin:    General: Skin is warm and dry.     Coloration: Skin is not pale.  Neurological:     Mental Status:  She is alert.    ED Results / Procedures / Treatments   Labs (all labs ordered are listed, but only abnormal results are displayed) Labs Reviewed  CBC WITH DIFFERENTIAL/PLATELET  BASIC METABOLIC PANEL  TROPONIN I (HIGH SENSITIVITY)  TROPONIN I (HIGH SENSITIVITY)    EKG EKG Interpretation  Date/Time:  Tuesday July 03 2021 15:58:49 EDT Ventricular Rate:  59 PR Interval:  186 QRS Duration: 74 QT Interval:  408 QTC Calculation: 403 R Axis:   11 Text Interpretation: Sinus bradycardia Cannot rule out Anterior infarct , age undetermined Abnormal ECG No significant change since last tracing Confirmed by Lacretia Leigh [phone]) on 07/03/2021 10:18:31 PM  Radiology DG Chest 2 View  Result Date: 07/03/2021 CLINICAL DATA:  Sudden onset of chest pain today. EXAM: CHEST - 2 VIEW COMPARISON:  Chest CTA 10/07/2018, remote prior radiograph. FINDINGS: The heart is normal in size. Similar aortic tortuosity. Stable mediastinal contours. No pulmonary edema, acute airspace disease, or pneumothorax. Suspected trace pleural effusions. No acute osseous abnormalities are seen. IMPRESSION: Suspected trace pleural effusions. Electronically Signed   By: Keith Rake M.D.   On: 07/03/2021 17:17    Procedures Procedures   Medications Ordered in ED Medications - No data to display  ED Course  I have reviewed the triage vital signs and the nursing notes.  Pertinent labs & imaging results that were available during my care of the patient were reviewed by me and considered in my medical decision making (see chart for details).  Patient seen and examined. Work-up assuring, including troponin negative x2, reassuring EKG, chest x-ray.  Doubt PE.  Symptoms are not really concerning for worsening dissection or aneurysm given how well she looks.  Patient discussed with and seen by Dr. Zenia Resides.  Agrees with plan for discharge.  Vital signs reviewed and are as follows: BP 135/85   Pulse 78   Temp 98.5 F  (36.9 C) (Oral)  Resp (!) 27   SpO2 98%   Patient was counseled to return with severe chest pain, especially if the pain is crushing or pressure-like and spreads to the arms, back, neck, or jaw, or if they have sweating, nausea, or shortness of breath with the pain. They were encouraged to call 911 with these symptoms.   They were also told to return if their chest pain gets worse and does not go away with rest, they have an attack of chest pain lasting longer than usual despite rest and treatment with the medications their caregiver has prescribed, if they wake from sleep with chest pain or shortness of breath, if they feel dizzy or faint, if they have chest pain not typical of their usual pain, or if they have any other emergent concerns regarding their health.  The patient verbalized understanding and agreed.     MDM Rules/Calculators/A&P                           CP: Atypical for ACS, PE, dissection.  Cardiac work-up is negative.  No shortness of breath.  Symptoms improved.  Potentially musculoskeletal.  Patient has also been under a lot of stress.  Final Clinical Impression(s) / ED Diagnoses Final diagnoses:  Precordial pain    Rx / DC Orders ED Discharge Orders     None        Carlisle Cater, Hershal Coria 07/03/21 2300    Lacretia Leigh, MD 07/04/21 1721

## 2021-07-03 NOTE — ED Triage Notes (Signed)
Pt reports sudden onset of chest pain earlier today that has since resolved. Pt denies SHOB, N/V, and abdominal pain.

## 2021-07-03 NOTE — Discharge Instructions (Signed)
Please read and follow all provided instructions.  Your diagnoses today include:  1. Precordial pain     Tests performed today include: An EKG of your heart A chest x-ray Cardiac enzymes - a blood test for heart muscle damage Blood counts and electrolytes Vital signs. See below for your results today.   Medications prescribed:  None  Take any prescribed medications only as directed.  Follow-up instructions: Please follow-up with your primary care provider as soon as you can for further evaluation of your symptoms.   Return instructions:  SEEK IMMEDIATE MEDICAL ATTENTION IF: You have severe chest pain, especially if the pain is crushing or pressure-like and spreads to the arms, back, neck, or jaw, or if you have sweating, nausea (feeling sick to your stomach), or shortness of breath. THIS IS AN EMERGENCY. Don't wait to see if the pain will go away. Get medical help at once. Call 911 or 0 (operator). DO NOT drive yourself to the hospital.  Your chest pain gets worse and does not go away with rest.  You have an attack of chest pain lasting longer than usual, despite rest and treatment with the medications your caregiver has prescribed.  You wake from sleep with chest pain or shortness of breath. You feel dizzy or faint. You have chest pain not typical of your usual pain for which you originally saw your caregiver.  You have any other emergent concerns regarding your health.  Additional Information: Chest pain comes from many different causes. Your caregiver has diagnosed you as having chest pain that is not specific for one problem, but does not require admission.  You are at low risk for an acute heart condition or other serious illness.   Your vital signs today were: BP (!) 150/82   Pulse (!) 59   Temp 98.5 F (36.9 C) (Oral)   Resp 16   SpO2 100%  If your blood pressure (BP) was elevated above 135/85 this visit, please have this repeated by your doctor within one  month. --------------

## 2021-07-03 NOTE — ED Provider Notes (Signed)
I provided a substantive portion of the care of this patient.  I personally performed the entirety of the medical decision making for this encounter.  EKG Interpretation  Date/Time:  Tuesday July 03 2021 15:58:49 EDT Ventricular Rate:  59 PR Interval:  186 QRS Duration: 74 QT Interval:  408 QTC Calculation: 403 R Axis:   11 Text Interpretation: Sinus bradycardia Cannot rule out Anterior infarct , age undetermined Abnormal ECG No significant change since last tracing Confirmed by Lacretia Leigh [phone]) on 07/03/2021 10:62:40 PM   75 year old female presents with left-sided pinpoint chest discomfort.  No associated dyspnea, diaphoresis.  Pain has come and gone and last for several minutes.  No exertional prior to it.  Troponin x2 is negative.  EKG without acute ischemic changes.  Patient does have a history of thoracic aneurysm however she has not had no severe back pain.  Chest x-ray shows no changes to her her mediastinal contour.  Will have patient follow-up as an outpatient   Lacretia Leigh, MD 07/03/21 2235

## 2021-07-05 ENCOUNTER — Ambulatory Visit
Admission: RE | Admit: 2021-07-05 | Discharge: 2021-07-05 | Disposition: A | Discharge status: Home / Self Care | Payer: No Typology Code available for payment source | Source: Ambulatory Visit | Attending: Obstetrics & Gynecology | Admitting: Obstetrics & Gynecology

## 2021-07-05 ENCOUNTER — Other Ambulatory Visit: Payer: Self-pay

## 2021-07-05 DIAGNOSIS — Z1231 Encounter for screening mammogram for malignant neoplasm of breast: Secondary | ICD-10-CM

## 2021-08-22 DIAGNOSIS — Z86018 Personal history of other benign neoplasm: Secondary | ICD-10-CM | POA: Diagnosis not present

## 2021-08-22 DIAGNOSIS — Z23 Encounter for immunization: Secondary | ICD-10-CM | POA: Diagnosis not present

## 2021-08-22 DIAGNOSIS — L720 Epidermal cyst: Secondary | ICD-10-CM | POA: Diagnosis not present

## 2021-08-22 DIAGNOSIS — D2272 Melanocytic nevi of left lower limb, including hip: Secondary | ICD-10-CM | POA: Diagnosis not present

## 2021-08-22 DIAGNOSIS — L820 Inflamed seborrheic keratosis: Secondary | ICD-10-CM | POA: Diagnosis not present

## 2021-08-22 DIAGNOSIS — Z85820 Personal history of malignant melanoma of skin: Secondary | ICD-10-CM | POA: Diagnosis not present

## 2021-08-22 DIAGNOSIS — L814 Other melanin hyperpigmentation: Secondary | ICD-10-CM | POA: Diagnosis not present

## 2021-08-22 DIAGNOSIS — D225 Melanocytic nevi of trunk: Secondary | ICD-10-CM | POA: Diagnosis not present

## 2021-08-22 DIAGNOSIS — L821 Other seborrheic keratosis: Secondary | ICD-10-CM | POA: Diagnosis not present

## 2021-08-22 DIAGNOSIS — L700 Acne vulgaris: Secondary | ICD-10-CM | POA: Diagnosis not present

## 2021-08-29 DIAGNOSIS — K623 Rectal prolapse: Secondary | ICD-10-CM | POA: Diagnosis not present

## 2021-09-05 ENCOUNTER — Other Ambulatory Visit: Payer: Self-pay | Admitting: Obstetrics & Gynecology

## 2021-09-05 DIAGNOSIS — Z1231 Encounter for screening mammogram for malignant neoplasm of breast: Secondary | ICD-10-CM

## 2021-09-07 DIAGNOSIS — K648 Other hemorrhoids: Secondary | ICD-10-CM | POA: Diagnosis not present

## 2021-09-07 DIAGNOSIS — M6281 Muscle weakness (generalized): Secondary | ICD-10-CM | POA: Diagnosis not present

## 2021-09-07 DIAGNOSIS — K623 Rectal prolapse: Secondary | ICD-10-CM | POA: Diagnosis not present

## 2021-09-07 DIAGNOSIS — R278 Other lack of coordination: Secondary | ICD-10-CM | POA: Diagnosis not present

## 2021-09-21 DIAGNOSIS — R278 Other lack of coordination: Secondary | ICD-10-CM | POA: Diagnosis not present

## 2021-09-21 DIAGNOSIS — M6281 Muscle weakness (generalized): Secondary | ICD-10-CM | POA: Diagnosis not present

## 2021-09-21 DIAGNOSIS — K623 Rectal prolapse: Secondary | ICD-10-CM | POA: Diagnosis not present

## 2021-09-21 DIAGNOSIS — K648 Other hemorrhoids: Secondary | ICD-10-CM | POA: Diagnosis not present

## 2021-09-28 DIAGNOSIS — U071 COVID-19: Secondary | ICD-10-CM | POA: Diagnosis not present

## 2021-10-05 DIAGNOSIS — K623 Rectal prolapse: Secondary | ICD-10-CM | POA: Diagnosis not present

## 2021-10-05 DIAGNOSIS — M6281 Muscle weakness (generalized): Secondary | ICD-10-CM | POA: Diagnosis not present

## 2021-10-05 DIAGNOSIS — R278 Other lack of coordination: Secondary | ICD-10-CM | POA: Diagnosis not present

## 2021-10-05 DIAGNOSIS — K648 Other hemorrhoids: Secondary | ICD-10-CM | POA: Diagnosis not present

## 2021-10-19 DIAGNOSIS — K623 Rectal prolapse: Secondary | ICD-10-CM | POA: Diagnosis not present

## 2021-10-19 DIAGNOSIS — K648 Other hemorrhoids: Secondary | ICD-10-CM | POA: Diagnosis not present

## 2021-10-19 DIAGNOSIS — M6281 Muscle weakness (generalized): Secondary | ICD-10-CM | POA: Diagnosis not present

## 2021-10-19 DIAGNOSIS — R278 Other lack of coordination: Secondary | ICD-10-CM | POA: Diagnosis not present

## 2021-11-01 DIAGNOSIS — F4322 Adjustment disorder with anxiety: Secondary | ICD-10-CM | POA: Diagnosis not present

## 2021-11-01 DIAGNOSIS — R69 Illness, unspecified: Secondary | ICD-10-CM | POA: Diagnosis not present

## 2021-11-23 DIAGNOSIS — K648 Other hemorrhoids: Secondary | ICD-10-CM | POA: Diagnosis not present

## 2021-11-23 DIAGNOSIS — R278 Other lack of coordination: Secondary | ICD-10-CM | POA: Diagnosis not present

## 2021-11-23 DIAGNOSIS — K623 Rectal prolapse: Secondary | ICD-10-CM | POA: Diagnosis not present

## 2021-11-23 DIAGNOSIS — M6281 Muscle weakness (generalized): Secondary | ICD-10-CM | POA: Diagnosis not present

## 2021-12-20 DIAGNOSIS — Z853 Personal history of malignant neoplasm of breast: Secondary | ICD-10-CM | POA: Diagnosis not present

## 2021-12-20 DIAGNOSIS — M85852 Other specified disorders of bone density and structure, left thigh: Secondary | ICD-10-CM | POA: Diagnosis not present

## 2021-12-20 DIAGNOSIS — E559 Vitamin D deficiency, unspecified: Secondary | ICD-10-CM | POA: Diagnosis not present

## 2021-12-20 DIAGNOSIS — I10 Essential (primary) hypertension: Secondary | ICD-10-CM | POA: Diagnosis not present

## 2021-12-20 DIAGNOSIS — Z8679 Personal history of other diseases of the circulatory system: Secondary | ICD-10-CM | POA: Diagnosis not present

## 2021-12-20 DIAGNOSIS — Z23 Encounter for immunization: Secondary | ICD-10-CM | POA: Diagnosis not present

## 2021-12-20 DIAGNOSIS — E782 Mixed hyperlipidemia: Secondary | ICD-10-CM | POA: Diagnosis not present

## 2022-02-26 DIAGNOSIS — L723 Sebaceous cyst: Secondary | ICD-10-CM | POA: Diagnosis not present

## 2022-03-28 DIAGNOSIS — H5213 Myopia, bilateral: Secondary | ICD-10-CM | POA: Diagnosis not present

## 2022-03-28 DIAGNOSIS — H524 Presbyopia: Secondary | ICD-10-CM | POA: Diagnosis not present

## 2022-03-28 DIAGNOSIS — H04122 Dry eye syndrome of left lacrimal gland: Secondary | ICD-10-CM | POA: Diagnosis not present

## 2022-03-28 DIAGNOSIS — H16142 Punctate keratitis, left eye: Secondary | ICD-10-CM | POA: Diagnosis not present

## 2022-03-28 DIAGNOSIS — H2513 Age-related nuclear cataract, bilateral: Secondary | ICD-10-CM | POA: Diagnosis not present

## 2022-03-28 DIAGNOSIS — H18592 Other hereditary corneal dystrophies, left eye: Secondary | ICD-10-CM | POA: Diagnosis not present

## 2022-03-28 DIAGNOSIS — H26493 Other secondary cataract, bilateral: Secondary | ICD-10-CM | POA: Diagnosis not present

## 2022-03-28 DIAGNOSIS — H18502 Unspecified hereditary corneal dystrophies, left eye: Secondary | ICD-10-CM | POA: Diagnosis not present

## 2022-03-28 DIAGNOSIS — H52223 Regular astigmatism, bilateral: Secondary | ICD-10-CM | POA: Diagnosis not present

## 2022-04-08 DIAGNOSIS — H26493 Other secondary cataract, bilateral: Secondary | ICD-10-CM | POA: Diagnosis not present

## 2022-04-10 DIAGNOSIS — H26492 Other secondary cataract, left eye: Secondary | ICD-10-CM | POA: Diagnosis not present

## 2022-04-17 DIAGNOSIS — H26491 Other secondary cataract, right eye: Secondary | ICD-10-CM | POA: Diagnosis not present

## 2022-04-24 DIAGNOSIS — M25511 Pain in right shoulder: Secondary | ICD-10-CM | POA: Diagnosis not present

## 2022-04-24 DIAGNOSIS — M7531 Calcific tendinitis of right shoulder: Secondary | ICD-10-CM | POA: Diagnosis not present

## 2022-05-06 DIAGNOSIS — L03116 Cellulitis of left lower limb: Secondary | ICD-10-CM | POA: Diagnosis not present

## 2022-05-06 DIAGNOSIS — S80862A Insect bite (nonvenomous), left lower leg, initial encounter: Secondary | ICD-10-CM | POA: Diagnosis not present

## 2022-05-13 DIAGNOSIS — L03113 Cellulitis of right upper limb: Secondary | ICD-10-CM | POA: Diagnosis not present

## 2022-06-27 DIAGNOSIS — Z124 Encounter for screening for malignant neoplasm of cervix: Secondary | ICD-10-CM | POA: Diagnosis not present

## 2022-06-27 DIAGNOSIS — Z01419 Encounter for gynecological examination (general) (routine) without abnormal findings: Secondary | ICD-10-CM | POA: Diagnosis not present

## 2022-06-27 DIAGNOSIS — Z6822 Body mass index (BMI) 22.0-22.9, adult: Secondary | ICD-10-CM | POA: Diagnosis not present

## 2022-07-02 DIAGNOSIS — H43813 Vitreous degeneration, bilateral: Secondary | ICD-10-CM | POA: Diagnosis not present

## 2022-07-02 DIAGNOSIS — H18593 Other hereditary corneal dystrophies, bilateral: Secondary | ICD-10-CM | POA: Diagnosis not present

## 2022-07-02 DIAGNOSIS — Z961 Presence of intraocular lens: Secondary | ICD-10-CM | POA: Diagnosis not present

## 2022-07-02 DIAGNOSIS — H52203 Unspecified astigmatism, bilateral: Secondary | ICD-10-CM | POA: Diagnosis not present

## 2022-07-08 ENCOUNTER — Other Ambulatory Visit: Payer: Self-pay | Admitting: Nurse Practitioner

## 2022-07-08 ENCOUNTER — Ambulatory Visit
Admission: RE | Admit: 2022-07-08 | Discharge: 2022-07-08 | Disposition: A | Discharge status: Home / Self Care | Payer: Medicare HMO | Source: Ambulatory Visit | Attending: Obstetrics & Gynecology | Admitting: Obstetrics & Gynecology

## 2022-07-08 DIAGNOSIS — Z1231 Encounter for screening mammogram for malignant neoplasm of breast: Secondary | ICD-10-CM

## 2022-07-11 DIAGNOSIS — E559 Vitamin D deficiency, unspecified: Secondary | ICD-10-CM | POA: Diagnosis not present

## 2022-07-11 DIAGNOSIS — E782 Mixed hyperlipidemia: Secondary | ICD-10-CM | POA: Diagnosis not present

## 2022-07-15 ENCOUNTER — Other Ambulatory Visit: Payer: Self-pay | Admitting: Family Medicine

## 2022-07-15 DIAGNOSIS — Z6823 Body mass index (BMI) 23.0-23.9, adult: Secondary | ICD-10-CM | POA: Diagnosis not present

## 2022-07-15 DIAGNOSIS — I10 Essential (primary) hypertension: Secondary | ICD-10-CM | POA: Diagnosis not present

## 2022-07-15 DIAGNOSIS — M85852 Other specified disorders of bone density and structure, left thigh: Secondary | ICD-10-CM

## 2022-07-15 DIAGNOSIS — H269 Unspecified cataract: Secondary | ICD-10-CM | POA: Diagnosis not present

## 2022-07-15 DIAGNOSIS — R319 Hematuria, unspecified: Secondary | ICD-10-CM | POA: Diagnosis not present

## 2022-07-15 DIAGNOSIS — Z0000 Encounter for general adult medical examination without abnormal findings: Secondary | ICD-10-CM | POA: Diagnosis not present

## 2022-07-15 DIAGNOSIS — E7800 Pure hypercholesterolemia, unspecified: Secondary | ICD-10-CM | POA: Diagnosis not present

## 2022-07-15 DIAGNOSIS — G250 Essential tremor: Secondary | ICD-10-CM | POA: Diagnosis not present

## 2022-07-15 DIAGNOSIS — Z8679 Personal history of other diseases of the circulatory system: Secondary | ICD-10-CM | POA: Diagnosis not present

## 2022-08-21 DIAGNOSIS — L821 Other seborrheic keratosis: Secondary | ICD-10-CM | POA: Diagnosis not present

## 2022-08-21 DIAGNOSIS — L814 Other melanin hyperpigmentation: Secondary | ICD-10-CM | POA: Diagnosis not present

## 2022-08-21 DIAGNOSIS — Z86018 Personal history of other benign neoplasm: Secondary | ICD-10-CM | POA: Diagnosis not present

## 2022-08-21 DIAGNOSIS — L710 Perioral dermatitis: Secondary | ICD-10-CM | POA: Diagnosis not present

## 2022-08-21 DIAGNOSIS — L578 Other skin changes due to chronic exposure to nonionizing radiation: Secondary | ICD-10-CM | POA: Diagnosis not present

## 2022-08-21 DIAGNOSIS — D225 Melanocytic nevi of trunk: Secondary | ICD-10-CM | POA: Diagnosis not present

## 2022-08-21 DIAGNOSIS — D2272 Melanocytic nevi of left lower limb, including hip: Secondary | ICD-10-CM | POA: Diagnosis not present

## 2022-11-05 DIAGNOSIS — Z6822 Body mass index (BMI) 22.0-22.9, adult: Secondary | ICD-10-CM | POA: Diagnosis not present

## 2022-11-05 DIAGNOSIS — R053 Chronic cough: Secondary | ICD-10-CM | POA: Diagnosis not present

## 2022-11-05 DIAGNOSIS — H6121 Impacted cerumen, right ear: Secondary | ICD-10-CM | POA: Diagnosis not present

## 2023-04-11 DIAGNOSIS — L03116 Cellulitis of left lower limb: Secondary | ICD-10-CM | POA: Diagnosis not present

## 2023-05-21 DIAGNOSIS — F330 Major depressive disorder, recurrent, mild: Secondary | ICD-10-CM | POA: Diagnosis not present

## 2023-05-29 DIAGNOSIS — H524 Presbyopia: Secondary | ICD-10-CM | POA: Diagnosis not present

## 2023-06-04 DIAGNOSIS — F330 Major depressive disorder, recurrent, mild: Secondary | ICD-10-CM | POA: Diagnosis not present

## 2023-06-06 ENCOUNTER — Other Ambulatory Visit: Payer: Self-pay | Admitting: Nurse Practitioner

## 2023-06-06 DIAGNOSIS — Z0000 Encounter for general adult medical examination without abnormal findings: Secondary | ICD-10-CM

## 2023-06-18 DIAGNOSIS — F330 Major depressive disorder, recurrent, mild: Secondary | ICD-10-CM | POA: Diagnosis not present

## 2023-07-02 DIAGNOSIS — F330 Major depressive disorder, recurrent, mild: Secondary | ICD-10-CM | POA: Diagnosis not present

## 2023-07-03 DIAGNOSIS — Z85820 Personal history of malignant melanoma of skin: Secondary | ICD-10-CM | POA: Diagnosis not present

## 2023-07-03 DIAGNOSIS — L820 Inflamed seborrheic keratosis: Secondary | ICD-10-CM | POA: Diagnosis not present

## 2023-07-09 DIAGNOSIS — H1045 Other chronic allergic conjunctivitis: Secondary | ICD-10-CM | POA: Diagnosis not present

## 2023-07-09 DIAGNOSIS — H11422 Conjunctival edema, left eye: Secondary | ICD-10-CM | POA: Diagnosis not present

## 2023-07-10 ENCOUNTER — Ambulatory Visit: Payer: No Typology Code available for payment source

## 2023-07-11 ENCOUNTER — Ambulatory Visit
Admission: RE | Admit: 2023-07-11 | Discharge: 2023-07-11 | Disposition: A | Discharge status: Home / Self Care | Payer: Medicare HMO | Source: Ambulatory Visit | Attending: Nurse Practitioner | Admitting: Nurse Practitioner

## 2023-07-11 DIAGNOSIS — Z0000 Encounter for general adult medical examination without abnormal findings: Secondary | ICD-10-CM

## 2023-07-11 DIAGNOSIS — Z6822 Body mass index (BMI) 22.0-22.9, adult: Secondary | ICD-10-CM | POA: Diagnosis not present

## 2023-07-11 DIAGNOSIS — K644 Residual hemorrhoidal skin tags: Secondary | ICD-10-CM | POA: Diagnosis not present

## 2023-07-11 DIAGNOSIS — Z1231 Encounter for screening mammogram for malignant neoplasm of breast: Secondary | ICD-10-CM | POA: Diagnosis not present

## 2023-07-11 DIAGNOSIS — Z1211 Encounter for screening for malignant neoplasm of colon: Secondary | ICD-10-CM | POA: Diagnosis not present

## 2023-07-11 DIAGNOSIS — Z01419 Encounter for gynecological examination (general) (routine) without abnormal findings: Secondary | ICD-10-CM | POA: Diagnosis not present

## 2023-07-15 DIAGNOSIS — E7800 Pure hypercholesterolemia, unspecified: Secondary | ICD-10-CM | POA: Diagnosis not present

## 2023-07-15 DIAGNOSIS — I10 Essential (primary) hypertension: Secondary | ICD-10-CM | POA: Diagnosis not present

## 2023-07-15 DIAGNOSIS — M85852 Other specified disorders of bone density and structure, left thigh: Secondary | ICD-10-CM | POA: Diagnosis not present

## 2023-07-16 DIAGNOSIS — F330 Major depressive disorder, recurrent, mild: Secondary | ICD-10-CM | POA: Diagnosis not present

## 2023-07-16 DIAGNOSIS — M8588 Other specified disorders of bone density and structure, other site: Secondary | ICD-10-CM | POA: Diagnosis not present

## 2023-07-22 ENCOUNTER — Telehealth: Payer: Self-pay | Admitting: Internal Medicine

## 2023-07-22 NOTE — Telephone Encounter (Signed)
Ok for surveillance colonoscopy in [facility] Hx of adenoma 10.2019 JMP

## 2023-07-22 NOTE — Telephone Encounter (Signed)
Good afternoon Dr. Rhea Belton  The following patient is being referred to [place] for a colonoscopy and specifically asked to be under your care. She was a patient of [facility] and she no longer wishes to continue care with them per her choice. She's not comfortable having them care for her. She had a colonoscopy 5 years ago and they found a tubular polyp and she is concerned about it. Records are available on Epic in media. Please review and advise of scheduling. Thank you.

## 2023-07-24 DIAGNOSIS — G250 Essential tremor: Secondary | ICD-10-CM | POA: Diagnosis not present

## 2023-07-24 DIAGNOSIS — M85852 Other specified disorders of bone density and structure, left thigh: Secondary | ICD-10-CM | POA: Diagnosis not present

## 2023-07-24 DIAGNOSIS — H269 Unspecified cataract: Secondary | ICD-10-CM | POA: Diagnosis not present

## 2023-07-24 DIAGNOSIS — Z86010 Personal history of colonic polyps: Secondary | ICD-10-CM | POA: Diagnosis not present

## 2023-07-24 DIAGNOSIS — E7800 Pure hypercholesterolemia, unspecified: Secondary | ICD-10-CM | POA: Diagnosis not present

## 2023-07-24 DIAGNOSIS — Z23 Encounter for immunization: Secondary | ICD-10-CM | POA: Diagnosis not present

## 2023-07-24 DIAGNOSIS — Z0000 Encounter for general adult medical examination without abnormal findings: Secondary | ICD-10-CM | POA: Diagnosis not present

## 2023-07-24 DIAGNOSIS — Z6822 Body mass index (BMI) 22.0-22.9, adult: Secondary | ICD-10-CM | POA: Diagnosis not present

## 2023-07-24 DIAGNOSIS — I712 Thoracic aortic aneurysm, without rupture, unspecified: Secondary | ICD-10-CM | POA: Diagnosis not present

## 2023-07-24 DIAGNOSIS — I10 Essential (primary) hypertension: Secondary | ICD-10-CM | POA: Diagnosis not present

## 2023-07-29 ENCOUNTER — Other Ambulatory Visit: Payer: Self-pay | Admitting: Family Medicine

## 2023-07-29 DIAGNOSIS — I712 Thoracic aortic aneurysm, without rupture, unspecified: Secondary | ICD-10-CM

## 2023-07-30 DIAGNOSIS — F330 Major depressive disorder, recurrent, mild: Secondary | ICD-10-CM | POA: Diagnosis not present

## 2023-08-25 ENCOUNTER — Encounter: Payer: Self-pay | Admitting: Family Medicine

## 2023-08-31 ENCOUNTER — Ambulatory Visit
Admission: RE | Admit: 2023-08-31 | Discharge: 2023-08-31 | Disposition: A | Discharge status: Home / Self Care | Payer: No Typology Code available for payment source | Source: Ambulatory Visit | Attending: Family Medicine | Admitting: Family Medicine

## 2023-08-31 DIAGNOSIS — I7123 Aneurysm of the descending thoracic aorta, without rupture: Secondary | ICD-10-CM | POA: Diagnosis not present

## 2023-08-31 DIAGNOSIS — I712 Thoracic aortic aneurysm, without rupture, unspecified: Secondary | ICD-10-CM

## 2023-08-31 MED ORDER — GADOPICLENOL 0.5 MMOL/ML IV SOLN
6.0000 mL | Freq: Once | INTRAVENOUS | Status: AC | PRN
  Administered 2023-08-31: 6 mL via INTRAVENOUS

## 2023-09-04 ENCOUNTER — Ambulatory Visit: Payer: Medicare HMO

## 2023-09-04 VITALS — Ht 64.5 in | Wt 130.0 lb

## 2023-09-04 DIAGNOSIS — Z860100 Personal history of colon polyps, unspecified: Secondary | ICD-10-CM

## 2023-09-04 MED ORDER — NA SULFATE-K SULFATE-MG SULF 17.5-3.13-1.6 GM/177ML PO SOLN: 1.0000 | Freq: Once | ORAL | 0 refills | Status: AC

## 2023-09-04 NOTE — Progress Notes (Addendum)
Pre visit completed in person; Patient verified name, DOB, and address; No egg or soy allergy known to patient;  No issues known to pt with past sedation with any surgeries or procedures---patient reports she "would like the least amount of anesthesia as possible as she doe snot like to take medications" Patient denies ever being told they had issues or difficulty with intubation;  No FH of Malignant Hyperthermia; Pt is not on diet pills; Pt is not on home 02; Pt is not on blood thinners;  Pt denies issues with constipation;  No A fib or A flutter; Have any cardiac testing pending--NO Insurance verified during PV appt--- [employer] Medicare Pt can ambulate without assistance;  Pt denies use of chewing tobacco; Discussed diabetic/weight loss medication holds; Discussed NSAID holds; Checked BMI to be less than 50; Pt instructed to use [employer] or GoodRx for a price reduction on prep;  Patient's chart reviewed by Cathlyn Parsons CNRA prior to previsit and patient appropriate for the [facility];  Pre visit completed and red dot placed by patient's name on their procedure day (on provider's schedule)    Instructions sent to MyChart as well as printed and given

## 2023-09-08 DIAGNOSIS — F330 Major depressive disorder, recurrent, mild: Secondary | ICD-10-CM | POA: Diagnosis not present

## 2023-09-10 DIAGNOSIS — D225 Melanocytic nevi of trunk: Secondary | ICD-10-CM | POA: Diagnosis not present

## 2023-09-10 DIAGNOSIS — L814 Other melanin hyperpigmentation: Secondary | ICD-10-CM | POA: Diagnosis not present

## 2023-09-10 DIAGNOSIS — D2271 Melanocytic nevi of right lower limb, including hip: Secondary | ICD-10-CM | POA: Diagnosis not present

## 2023-09-10 DIAGNOSIS — D2272 Melanocytic nevi of left lower limb, including hip: Secondary | ICD-10-CM | POA: Diagnosis not present

## 2023-09-10 DIAGNOSIS — L719 Rosacea, unspecified: Secondary | ICD-10-CM | POA: Diagnosis not present

## 2023-09-10 DIAGNOSIS — Z86018 Personal history of other benign neoplasm: Secondary | ICD-10-CM | POA: Diagnosis not present

## 2023-09-10 DIAGNOSIS — L710 Perioral dermatitis: Secondary | ICD-10-CM | POA: Diagnosis not present

## 2023-09-10 DIAGNOSIS — L821 Other seborrheic keratosis: Secondary | ICD-10-CM | POA: Diagnosis not present

## 2023-09-10 DIAGNOSIS — D485 Neoplasm of uncertain behavior of skin: Secondary | ICD-10-CM | POA: Diagnosis not present

## 2023-09-10 DIAGNOSIS — L578 Other skin changes due to chronic exposure to nonionizing radiation: Secondary | ICD-10-CM | POA: Diagnosis not present

## 2023-09-16 DIAGNOSIS — H52203 Unspecified astigmatism, bilateral: Secondary | ICD-10-CM | POA: Diagnosis not present

## 2023-09-16 DIAGNOSIS — Z961 Presence of intraocular lens: Secondary | ICD-10-CM | POA: Diagnosis not present

## 2023-09-16 DIAGNOSIS — H43813 Vitreous degeneration, bilateral: Secondary | ICD-10-CM | POA: Diagnosis not present

## 2023-09-19 ENCOUNTER — Encounter: Payer: Self-pay | Admitting: Internal Medicine

## 2023-09-30 ENCOUNTER — Encounter: Payer: Self-pay | Admitting: Internal Medicine

## 2023-09-30 ENCOUNTER — Ambulatory Visit: Payer: Medicare HMO | Admitting: Internal Medicine

## 2023-09-30 VITALS — BP 119/68 | HR 64 | Temp 97.7°F | Resp 15 | Ht 64.5 in | Wt 130.0 lb

## 2023-09-30 DIAGNOSIS — D122 Benign neoplasm of ascending colon: Secondary | ICD-10-CM | POA: Diagnosis not present

## 2023-09-30 DIAGNOSIS — D124 Benign neoplasm of descending colon: Secondary | ICD-10-CM

## 2023-09-30 DIAGNOSIS — Z09 Encounter for follow-up examination after completed treatment for conditions other than malignant neoplasm: Secondary | ICD-10-CM

## 2023-09-30 DIAGNOSIS — Z860100 Personal history of colon polyps, unspecified: Secondary | ICD-10-CM

## 2023-09-30 DIAGNOSIS — K635 Polyp of colon: Secondary | ICD-10-CM | POA: Diagnosis not present

## 2023-09-30 DIAGNOSIS — D120 Benign neoplasm of cecum: Secondary | ICD-10-CM

## 2023-09-30 MED ORDER — SODIUM CHLORIDE 0.9 % IV SOLN
500.0000 mL | INTRAVENOUS | Status: DC
Start: 2023-09-30 — End: 2023-09-30

## 2023-09-30 NOTE — Progress Notes (Signed)
VS by DT  Pt's states no medical or surgical changes since previsit or office visit.  

## 2023-09-30 NOTE — Progress Notes (Signed)
GASTROENTEROLOGY PROCEDURE H&P NOTE   Primary Care Physician: Gweneth Dimitri, MD    Reason for Procedure:  History of adenomatous polyp October 2019  Plan:    Colonoscopy  Patient is appropriate for endoscopic procedure(s) in the ambulatory ([facility]) setting.  The nature of the procedure, as well as the risks, benefits, and alternatives were carefully and thoroughly reviewed with the patient. Ample time for discussion and questions allowed. The patient understood, was satisfied, and agreed to proceed.     HPI: Catherine Holland is a 77 y.o. female who presents for surveillance colonoscopy.  Medical history as below.  Tolerated the prep.  No recent chest pain or shortness of breath.  No abdominal pain today.  Past Medical History:  Diagnosis Date   ABNORMAL EKG 11/09/2009   Qualifier: Diagnosis of  By: Gala Romney, MD, Trixie Dredge    Arthritis    bilateral hands   Breast cancer ([facility]) 05/08/2019   RIGHT   Cataract 2022   bilateral sx   Chest pain, unspecified    --normal myoview in july 2010    Chronic pain 01/26/2021   Constipation 01/26/2021   Diverticular disease of colon 01/26/2021   Dyslipidemia 09/22/2018   Encounter for general adult medical examination without abnormal findings 01/26/2021   Essential hypertension, benign 03/25/2011   Ganglion cyst of left foot 07/13/2019   Generalized anxiety disorder 01/26/2021   GERD (gastroesophageal reflux disease)    hx of   Hematochezia 01/26/2021   Mixed hyperlipidemia 01/26/2021   Osteopenia of left thigh 01/26/2021   Palpitations 11/09/2009   Qualifier: Diagnosis of  By: Trevor Iha, RN, Heather     Personal history of malignant melanoma of skin 01/26/2021   Personal history of malignant neoplasm of breast 01/26/2021   Personal history of radiation therapy 2007   Seasonal allergies    Tachycardia    -monitor 12/10. Sr with PACs --echo 12/10. EF normal. no signifcant valvular abnormalities. --ETT normal 4/11     TACHYCARDIA  11/07/2009   Qualifier: Diagnosis of  By: Bascom Levels, RMA, Sherri     Thoracic aortic aneurysm [facility])    Thoracic aortic aneurysm (TAA) ([facility]) 01/08/2010   Qualifier: Diagnosis of  By: Trevor Iha, RN, Heather     Vitamin D deficiency 01/26/2021    Past Surgical History:  Procedure Laterality Date   BREAST BIOPSY Right 2007   BREAST LUMPECTOMY Right 2007   CATARACT EXTRACTION, BILATERAL Bilateral 2022   Cyst removal from L mid toe     INGUINAL HERNIA REPAIR Left 1985   MASS EXCISION Right 03/25/2017   Procedure: EXCISION MUCOID CYST WITH DISTAL PHANGEAL JOINT ARTHROTOMY RIGHT INDEX FINGER;  Surgeon: Cindee Salt, MD;  Location: [facility];  Service: Orthopedics;  Laterality: Right;  REG/FAB   Right heel     TUBAL LIGATION  1985    Prior to Admission medications   Medication Sig Start Date End Date Taking? Authorizing Provider  Cholecalciferol (VITAMIN D) 50 MCG (2000 UT) CAPS Take 1 capsule by mouth daily at 6 (six) AM. Take 2 tabs once daily.   Yes [provider]  losartan (COZAAR) 25 MG tablet Take 1 tablet (25 mg total) by mouth daily. Patient taking differently: Take 25 mg by mouth at bedtime. 01/31/21 09/30/23 Yes Georgeanna Lea, MD  Ascorbic Acid (VITAMIN C PO) Take 1 tablet by mouth daily at 6 (six) AM.    [provider]  B Complex Vitamins (B COMPLEX 1 PO) Take 1 tablet by mouth  daily.    [provider]  metroNIDAZOLE (METROCREAM) 0.75 % cream APPLY DAILY TO SKIN TO AFFECTED AREA EVERY DAY FOR 30 DAYS for 90 days  06/07/24  [provider]  tretinoin (RETIN-A) 0.025 % cream Apply 1 application topically as needed (Acne).    [provider]    Current Outpatient Medications  Medication Sig Dispense Refill   Cholecalciferol (VITAMIN D) 50 MCG (2000 UT) CAPS Take 1 capsule by mouth daily at 6 (six) AM. Take 2 tabs once daily.     losartan (COZAAR) 25 MG tablet Take 1 tablet (25 mg total) by mouth daily. (Patient taking  differently: Take 25 mg by mouth at bedtime.) 90 tablet 3   Ascorbic Acid (VITAMIN C PO) Take 1 tablet by mouth daily at 6 (six) AM.     B Complex Vitamins (B COMPLEX 1 PO) Take 1 tablet by mouth daily.     metroNIDAZOLE (METROCREAM) 0.75 % cream APPLY DAILY TO SKIN TO AFFECTED AREA EVERY DAY FOR 30 DAYS for 90 days     tretinoin (RETIN-A) 0.025 % cream Apply 1 application topically as needed (Acne).     Current Facility-Administered Medications  Medication Dose Route Frequency Provider Last Rate Last Admin   0.9 %  sodium chloride infusion  500 mL Intravenous Continuous Randolf Sansoucie, Carie Caddy, MD        Allergies as of 09/30/2023 - Review Complete 09/30/2023  Allergen Reaction Noted   Letrozole  09/04/2023   Meperidine hcl Itching 11/27/2020   Meperidine hcl Itching 11/27/2020   Other  11/27/2020   Tape Other (See Comments) and Rash 03/25/2017    Family History  Problem Relation Age of Onset   Hypertension Mother    Heart attack Mother    Diabetes Father    Heart disease Brother    Heart disease Brother    Angina Other    Stroke Other    Hypertension Other    Liver disease Other    Breast cancer Neg Hx    Colon polyps Neg Hx    Colon cancer Neg Hx    Esophageal cancer Neg Hx    Stomach cancer Neg Hx    Rectal cancer Neg Hx     Social History   Socioeconomic History   Marital status: Married    Spouse name: Not on file   Number of children: 2   Years of education: Not on file   Highest education level: Not on file  Occupational History   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   Drug use: No   Sexual activity: Not on file  Other Topics Concern   Not on file  Social History Narrative   Not on file   Social  Determinants of Health   Financial Resource Strain: Not on file  Food Insecurity: Not on file  Transportation Needs: Not on file  Physical Activity: Not on file  Stress: Not on file  Social Connections: Not on file  Intimate Partner Violence: Not on file    Physical Exam: Vital signs in last 24 hours: @BP  123/72   Pulse 66   Temp 97.7 F (36.5 C) (Skin)   Ht 5' 4.5" (1.638 m)  Wt 130 lb (59 kg)   SpO2 98%   BMI 21.97 kg/m  GEN: NAD EYE: Sclerae anicteric ENT: MMM CV: Non-tachycardic Pulm: CTA b/l GI: Soft, NT/ND NEURO:  Alert & Oriented x 3   Erick Blinks, MD [facility]  09/30/2023 1:26 PM

## 2023-09-30 NOTE — Patient Instructions (Signed)
 Educational handout provided to patient related to Hemorrhoids, Polyps, and Diverticulosis  Resume previous diet  Continue present medications  Awaiting pathology results   YOU HAD AN ENDOSCOPIC PROCEDURE TODAY AT THE [facility]:   Refer to the procedure report that was given to you for any specific questions about what was found during the examination.  If the procedure report does not answer your questions, please call your gastroenterologist to clarify.  If you requested that your care partner not be given the details of your procedure findings, then the procedure report has been included in a sealed envelope for you to review at your convenience later.  YOU SHOULD EXPECT: Some feelings of bloating in the abdomen. Passage of more gas than usual.  Walking can help get rid of the air that was put into your GI tract during the procedure and reduce the bloating. If you had a lower endoscopy (such as a colonoscopy or flexible sigmoidoscopy) you may notice spotting of blood in your stool or on the toilet paper. If you underwent a bowel prep for your procedure, you may not have a normal bowel movement for a few days.  Please Note:  You might notice some irritation and congestion in your nose or some drainage.  This is from the oxygen used during your procedure.  There is no need for concern and it should clear up in a day or so.  SYMPTOMS TO REPORT IMMEDIATELY:  Following lower endoscopy (colonoscopy or flexible sigmoidoscopy):  Excessive amounts of blood in the stool  Significant tenderness or worsening of abdominal pains  Swelling of the abdomen that is new, acute  Fever of 100F or higher   For urgent or emergent issues, a gastroenterologist can be reached at any hour by calling [phone]. Do not use MyChart messaging for urgent concerns.    DIET:  We do recommend a small meal at first, but then you may proceed to your regular diet.  Drink plenty of fluids but you should  avoid alcoholic beverages for 24 hours.  ACTIVITY:  You should plan to take it easy for the rest of today and you should NOT DRIVE or use heavy machinery until tomorrow (because of the sedation medicines used during the test).    FOLLOW UP: Our staff will call the number listed on your records the next business day following your procedure.  We will call around 7:15- 8:00 am to check on you and address any questions or concerns that you may have regarding the information given to you following your procedure. If we do not reach you, we will leave a message.     If any biopsies were taken you will be contacted by phone or by letter within the next 1-3 weeks.  Please call us at [phone] if you have not heard about the biopsies in 3 weeks.    SIGNATURES/CONFIDENTIALITY: You and/or your care partner have signed paperwork which will be entered into your electronic medical record.  These signatures attest to the fact that that the information above on your After Visit Summary has been reviewed and is understood.  Full responsibility of the confidentiality of this discharge information lies with you and/or your care-partner.

## 2023-09-30 NOTE — Progress Notes (Signed)
Report to PACU, RN, vss, BBS= Clear.  

## 2023-09-30 NOTE — Progress Notes (Signed)
Called to room to assist during endoscopic procedure.  Patient ID and intended procedure confirmed with present staff. Received instructions for my participation in the procedure from the performing physician.  

## 2023-09-30 NOTE — Op Note (Signed)
[facility] Patient Name: Catherine Holland Procedure Date: 09/30/2023 1:43 PM MRN: [id number] Endoscopist: Beverley Fiedler , MD, [id number] Age: 77 Referring MD:  Date of Birth: 1946-01-29 Gender: Female Account #: [id number] Procedure:                Colonoscopy Indications:              High risk colon cancer surveillance: Personal                            history of non-advanced adenoma, Last colonoscopy:                            October 2019 Medicines:                Monitored Anesthesia Care Procedure:                Pre-Anesthesia Assessment:                           - Prior to the procedure, a History and Physical                            was performed, and patient medications and                            allergies were reviewed. The patient's tolerance of                            previous anesthesia was also reviewed. The risks                            and benefits of the procedure and the sedation                            options and risks were discussed with the patient.                            All questions were answered, and informed consent                            was obtained. Prior Anticoagulants: The patient has                            taken no anticoagulant or antiplatelet agents. ASA                            Grade Assessment: II - A patient with mild systemic                            disease. After reviewing the risks and benefits,                            the patient was deemed in satisfactory condition to  undergo the procedure.                           After obtaining informed consent, the colonoscope                            was passed under direct vision. Throughout the                            procedure, the patient's blood pressure, pulse, and                            oxygen saturations were monitored continuously. The                            Olympus Scope [id number] was introduced through  the                            anus and advanced to the cecum, identified by                            appendiceal orifice and ileocecal valve. The                            colonoscopy was performed without difficulty. The                            patient tolerated the procedure well. The quality                            of the bowel preparation was good. The ileocecal                            valve, appendiceal orifice, and rectum were                            photographed. Scope In: 1:49:40 PM Scope Out: 2:04:36 PM Scope Withdrawal Time: 0 hours 11 minutes 41 seconds  Total Procedure Duration: 0 hours 14 minutes 56 seconds  Findings:                 Hemorrhoids were found on perianal exam.                           A 5 mm polyp was found in the cecum. The polyp was                            sessile. The polyp was removed with a cold snare.                            Resection and retrieval were complete.                           A 2 mm polyp was found in the ascending colon. The  polyp was sessile. The polyp was removed with a                            cold snare. Resection and retrieval were complete.                           A 3 mm polyp was found in the descending colon. The                            polyp was sessile. The polyp was removed with a                            cold snare. Resection and retrieval were complete.                           Multiple large-mouthed, medium-mouthed and                            small-mouthed diverticula were found in the sigmoid                            colon, descending colon and hepatic flexure.                           Internal hemorrhoids were found during                            retroflexion. The hemorrhoids were medium-sized. Complications:            No immediate complications. Estimated Blood Loss:     Estimated blood loss was minimal. Impression:               - One 5 mm polyp in the  cecum, removed with a cold                            snare. Resected and retrieved.                           - One 2 mm polyp in the ascending colon, removed                            with a cold snare. Resected and retrieved.                           - One 3 mm polyp in the descending colon, removed                            with a cold snare. Resected and retrieved.                           - Moderate diverticulosis in the sigmoid colon, in                            the descending colon  and at the hepatic flexure.                           - Internal hemorrhoids with prolapse. Recommendation:           - Patient has a contact number available for                            emergencies. The signs and symptoms of potential                            delayed complications were discussed with the                            patient. Return to normal activities tomorrow.                            Written discharge instructions were provided to the                            patient.                           - Resume previous diet.                           - Continue present medications.                           - Await pathology results.                           - Repeat colonoscopy is recommended for                            surveillance. The colonoscopy date will be                            determined after pathology results from today's                            exam become available for review. Beverley Fiedler, MD 09/30/2023 2:07:44 PM This report has been signed electronically.

## 2023-10-01 ENCOUNTER — Telehealth: Payer: Self-pay

## 2023-10-01 NOTE — Telephone Encounter (Signed)
  Follow up Call-     09/30/2023   12:56 PM  Call back number  Post procedure Call Back phone  # [phone]  Permission to leave phone message Yes     Left message

## 2023-10-03 LAB — SURGICAL PATHOLOGY

## 2023-10-07 ENCOUNTER — Encounter: Payer: Self-pay | Admitting: Internal Medicine

## 2023-11-25 ENCOUNTER — Other Ambulatory Visit: Payer: Self-pay | Admitting: Medical Genetics

## 2023-12-01 DIAGNOSIS — R079 Chest pain, unspecified: Secondary | ICD-10-CM | POA: Diagnosis not present

## 2023-12-19 DIAGNOSIS — M25571 Pain in right ankle and joints of right foot: Secondary | ICD-10-CM | POA: Diagnosis not present

## 2023-12-19 DIAGNOSIS — G8929 Other chronic pain: Secondary | ICD-10-CM | POA: Diagnosis not present

## 2023-12-19 DIAGNOSIS — Z6822 Body mass index (BMI) 22.0-22.9, adult: Secondary | ICD-10-CM | POA: Diagnosis not present

## 2023-12-31 DIAGNOSIS — H1045 Other chronic allergic conjunctivitis: Secondary | ICD-10-CM | POA: Diagnosis not present

## 2023-12-31 DIAGNOSIS — J3089 Other allergic rhinitis: Secondary | ICD-10-CM | POA: Diagnosis not present

## 2023-12-31 DIAGNOSIS — J300 Vasomotor rhinitis: Secondary | ICD-10-CM | POA: Diagnosis not present

## 2024-01-06 DIAGNOSIS — M25571 Pain in right ankle and joints of right foot: Secondary | ICD-10-CM | POA: Diagnosis not present

## 2024-01-08 DIAGNOSIS — M25571 Pain in right ankle and joints of right foot: Secondary | ICD-10-CM | POA: Diagnosis not present

## 2024-01-13 DIAGNOSIS — M25571 Pain in right ankle and joints of right foot: Secondary | ICD-10-CM | POA: Diagnosis not present

## 2024-01-15 DIAGNOSIS — M25571 Pain in right ankle and joints of right foot: Secondary | ICD-10-CM | POA: Diagnosis not present

## 2024-01-21 DIAGNOSIS — M25571 Pain in right ankle and joints of right foot: Secondary | ICD-10-CM | POA: Diagnosis not present

## 2024-01-23 DIAGNOSIS — M25571 Pain in right ankle and joints of right foot: Secondary | ICD-10-CM | POA: Diagnosis not present

## 2024-01-28 DIAGNOSIS — M25571 Pain in right ankle and joints of right foot: Secondary | ICD-10-CM | POA: Diagnosis not present

## 2024-01-29 ENCOUNTER — Ambulatory Visit

## 2024-01-29 ENCOUNTER — Ambulatory Visit (INDEPENDENT_AMBULATORY_CARE_PROVIDER_SITE_OTHER): Admitting: Sports Medicine

## 2024-01-29 DIAGNOSIS — G8929 Other chronic pain: Secondary | ICD-10-CM | POA: Diagnosis not present

## 2024-01-29 DIAGNOSIS — M2011 Hallux valgus (acquired), right foot: Secondary | ICD-10-CM

## 2024-01-29 DIAGNOSIS — M25571 Pain in right ankle and joints of right foot: Secondary | ICD-10-CM | POA: Diagnosis not present

## 2024-01-29 DIAGNOSIS — M25561 Pain in right knee: Secondary | ICD-10-CM | POA: Diagnosis not present

## 2024-01-29 DIAGNOSIS — M19071 Primary osteoarthritis, right ankle and foot: Secondary | ICD-10-CM | POA: Diagnosis not present

## 2024-01-29 DIAGNOSIS — M1711 Unilateral primary osteoarthritis, right knee: Secondary | ICD-10-CM

## 2024-01-29 DIAGNOSIS — M170 Bilateral primary osteoarthritis of knee: Secondary | ICD-10-CM | POA: Diagnosis not present

## 2024-01-29 MED ORDER — MELOXICAM 15 MG PO TABS: ORAL_TABLET | ORAL | 3 refills | Status: AC

## 2024-01-29 NOTE — Assessment & Plan Note (Signed)
 Increasing pain right knee as well, crepitus on exam, mild visible swelling. Suspect osteoarthritis, adding x-rays, she will continue her PT, she will get some knee sleeves, meloxicam as above. Return to see me in 4 to 6 weeks.

## 2024-01-29 NOTE — Assessment & Plan Note (Signed)
 Pleasant 78 year old female, history of what sounds to be heel cord lengthening for an equinus foot on the right decades ago. Since then she has had on and off pain right foot and ankle, more recently consistent pain that she localizes over the sinus tarsi and dorsal lateral midfoot. On exam she does have some pes planus, she has significant gastrocnemius and soleus atrophy. She has significant weakness to eversion, good strength in other directions. No discrete areas of tenderness to palpation. Unclear etiology, we will get her set up with meloxicam, x-rays, she will [facility] formal [facility] [place]. I would also like her to get some custom molded orthotics. Return to see me about 4 weeks, MRI if not better.

## 2024-01-29 NOTE — Progress Notes (Signed)
    Procedures performed today:    None.  Independent interpretation of notes and tests performed by another provider:   None.  Brief History, Exam, Impression, and Recommendations:    Chronic pain of right ankle Pleasant 78 year old female, history of what sounds to be heel cord lengthening for an equinus foot on the right decades ago. Since then she has had on and off pain right foot and ankle, more recently consistent pain that she localizes over the sinus tarsi and dorsal lateral midfoot. On exam she does have some pes planus, she has significant gastrocnemius and soleus atrophy. She has significant weakness to eversion, good strength in other directions. No discrete areas of tenderness to palpation. Unclear etiology, we will get her set up with meloxicam, x-rays, she will continue formal physical therapy at [facility] [place]. I would also like her to get some custom molded orthotics. Return to see me about 4 weeks, MRI if not better.  Primary osteoarthritis of right knee Increasing pain right knee as well, crepitus on exam, mild visible swelling. Suspect osteoarthritis, adding x-rays, she will continue her PT, she will get some knee sleeves, meloxicam as above. Return to see me in 4 to 6 weeks.  Chronic process with exacerbation and pharmacologic intervention  ____________________________________________ Ihor Austin. Benjamin Stain, M.D., ABFM., CAQSM., AME. Primary Care and Sports Medicine [facility]  Adjunct Professor of Family Medicine  [employer]  Restaurant manager, fast food

## 2024-01-30 DIAGNOSIS — M25571 Pain in right ankle and joints of right foot: Secondary | ICD-10-CM | POA: Diagnosis not present

## 2024-02-05 DIAGNOSIS — M25561 Pain in right knee: Secondary | ICD-10-CM | POA: Diagnosis not present

## 2024-02-05 DIAGNOSIS — M25571 Pain in right ankle and joints of right foot: Secondary | ICD-10-CM | POA: Diagnosis not present

## 2024-02-05 DIAGNOSIS — M25551 Pain in right hip: Secondary | ICD-10-CM | POA: Diagnosis not present

## 2024-02-11 DIAGNOSIS — M25561 Pain in right knee: Secondary | ICD-10-CM | POA: Diagnosis not present

## 2024-02-11 DIAGNOSIS — M25571 Pain in right ankle and joints of right foot: Secondary | ICD-10-CM | POA: Diagnosis not present

## 2024-02-11 DIAGNOSIS — M25551 Pain in right hip: Secondary | ICD-10-CM | POA: Diagnosis not present

## 2024-02-12 DIAGNOSIS — N819 Female genital prolapse, unspecified: Secondary | ICD-10-CM | POA: Diagnosis not present

## 2024-02-12 DIAGNOSIS — N8111 Cystocele, midline: Secondary | ICD-10-CM | POA: Diagnosis not present

## 2024-02-12 DIAGNOSIS — N952 Postmenopausal atrophic vaginitis: Secondary | ICD-10-CM | POA: Diagnosis not present

## 2024-02-12 DIAGNOSIS — R32 Unspecified urinary incontinence: Secondary | ICD-10-CM | POA: Diagnosis not present

## 2024-02-12 DIAGNOSIS — R351 Nocturia: Secondary | ICD-10-CM | POA: Diagnosis not present

## 2024-02-13 DIAGNOSIS — M25551 Pain in right hip: Secondary | ICD-10-CM | POA: Diagnosis not present

## 2024-02-13 DIAGNOSIS — M25571 Pain in right ankle and joints of right foot: Secondary | ICD-10-CM | POA: Diagnosis not present

## 2024-02-13 DIAGNOSIS — M25561 Pain in right knee: Secondary | ICD-10-CM | POA: Diagnosis not present

## 2024-02-19 DIAGNOSIS — M25571 Pain in right ankle and joints of right foot: Secondary | ICD-10-CM | POA: Diagnosis not present

## 2024-02-19 DIAGNOSIS — M25551 Pain in right hip: Secondary | ICD-10-CM | POA: Diagnosis not present

## 2024-02-19 DIAGNOSIS — M25561 Pain in right knee: Secondary | ICD-10-CM | POA: Diagnosis not present

## 2024-02-25 DIAGNOSIS — M25561 Pain in right knee: Secondary | ICD-10-CM | POA: Diagnosis not present

## 2024-02-25 DIAGNOSIS — M25571 Pain in right ankle and joints of right foot: Secondary | ICD-10-CM | POA: Diagnosis not present

## 2024-02-25 DIAGNOSIS — M25551 Pain in right hip: Secondary | ICD-10-CM | POA: Diagnosis not present

## 2024-02-27 DIAGNOSIS — M25551 Pain in right hip: Secondary | ICD-10-CM | POA: Diagnosis not present

## 2024-02-27 DIAGNOSIS — M25561 Pain in right knee: Secondary | ICD-10-CM | POA: Diagnosis not present

## 2024-02-27 DIAGNOSIS — M25571 Pain in right ankle and joints of right foot: Secondary | ICD-10-CM | POA: Diagnosis not present

## 2024-04-15 DIAGNOSIS — S50861A Insect bite (nonvenomous) of right forearm, initial encounter: Secondary | ICD-10-CM | POA: Diagnosis not present

## 2024-04-15 DIAGNOSIS — W57XXXA Bitten or stung by nonvenomous insect and other nonvenomous arthropods, initial encounter: Secondary | ICD-10-CM | POA: Diagnosis not present

## 2024-04-17 DIAGNOSIS — L03116 Cellulitis of left lower limb: Secondary | ICD-10-CM | POA: Diagnosis not present

## 2024-05-06 ENCOUNTER — Other Ambulatory Visit: Payer: Self-pay | Admitting: Nurse Practitioner

## 2024-05-06 DIAGNOSIS — Z1231 Encounter for screening mammogram for malignant neoplasm of breast: Secondary | ICD-10-CM

## 2024-05-18 DIAGNOSIS — N812 Incomplete uterovaginal prolapse: Secondary | ICD-10-CM | POA: Diagnosis not present

## 2024-05-18 DIAGNOSIS — K641 Second degree hemorrhoids: Secondary | ICD-10-CM | POA: Diagnosis not present

## 2024-05-18 DIAGNOSIS — N393 Stress incontinence (female) (male): Secondary | ICD-10-CM | POA: Diagnosis not present

## 2024-05-18 DIAGNOSIS — R151 Fecal smearing: Secondary | ICD-10-CM | POA: Diagnosis not present

## 2024-06-10 DIAGNOSIS — H5213 Myopia, bilateral: Secondary | ICD-10-CM | POA: Diagnosis not present

## 2024-06-10 DIAGNOSIS — Z0100 Encounter for examination of eyes and vision without abnormal findings: Secondary | ICD-10-CM | POA: Diagnosis not present

## 2024-06-22 DIAGNOSIS — N812 Incomplete uterovaginal prolapse: Secondary | ICD-10-CM | POA: Diagnosis not present

## 2024-06-24 DIAGNOSIS — R151 Fecal smearing: Secondary | ICD-10-CM | POA: Diagnosis not present

## 2024-06-24 DIAGNOSIS — N812 Incomplete uterovaginal prolapse: Secondary | ICD-10-CM | POA: Diagnosis not present

## 2024-07-09 DIAGNOSIS — R159 Full incontinence of feces: Secondary | ICD-10-CM | POA: Diagnosis not present

## 2024-07-09 DIAGNOSIS — N813 Complete uterovaginal prolapse: Secondary | ICD-10-CM | POA: Diagnosis not present

## 2024-07-09 DIAGNOSIS — N814 Uterovaginal prolapse, unspecified: Secondary | ICD-10-CM | POA: Diagnosis not present

## 2024-07-09 DIAGNOSIS — I10 Essential (primary) hypertension: Secondary | ICD-10-CM | POA: Diagnosis not present

## 2024-07-09 DIAGNOSIS — Z79899 Other long term (current) drug therapy: Secondary | ICD-10-CM | POA: Diagnosis not present

## 2024-07-09 HISTORY — PX: SACROSPINOUS LIGAMENT FIXATION: SHX2371

## 2024-07-12 ENCOUNTER — Ambulatory Visit

## 2024-07-12 DIAGNOSIS — N393 Stress incontinence (female) (male): Secondary | ICD-10-CM | POA: Diagnosis not present

## 2024-07-20 ENCOUNTER — Encounter: Payer: Self-pay | Admitting: Sports Medicine

## 2024-07-23 DIAGNOSIS — E7800 Pure hypercholesterolemia, unspecified: Secondary | ICD-10-CM | POA: Diagnosis not present

## 2024-07-23 DIAGNOSIS — I10 Essential (primary) hypertension: Secondary | ICD-10-CM | POA: Diagnosis not present

## 2024-07-23 DIAGNOSIS — M85852 Other specified disorders of bone density and structure, left thigh: Secondary | ICD-10-CM | POA: Diagnosis not present

## 2024-07-28 DIAGNOSIS — N393 Stress incontinence (female) (male): Secondary | ICD-10-CM | POA: Diagnosis not present

## 2024-07-28 DIAGNOSIS — Z0000 Encounter for general adult medical examination without abnormal findings: Secondary | ICD-10-CM | POA: Diagnosis not present

## 2024-07-28 DIAGNOSIS — M85852 Other specified disorders of bone density and structure, left thigh: Secondary | ICD-10-CM | POA: Diagnosis not present

## 2024-07-28 DIAGNOSIS — I10 Essential (primary) hypertension: Secondary | ICD-10-CM | POA: Diagnosis not present

## 2024-07-28 DIAGNOSIS — I7123 Aneurysm of the descending thoracic aorta, without rupture: Secondary | ICD-10-CM | POA: Diagnosis not present

## 2024-07-28 DIAGNOSIS — Z6822 Body mass index (BMI) 22.0-22.9, adult: Secondary | ICD-10-CM | POA: Diagnosis not present

## 2024-07-28 DIAGNOSIS — E782 Mixed hyperlipidemia: Secondary | ICD-10-CM | POA: Diagnosis not present

## 2024-07-28 DIAGNOSIS — E559 Vitamin D deficiency, unspecified: Secondary | ICD-10-CM | POA: Diagnosis not present

## 2024-07-28 DIAGNOSIS — K644 Residual hemorrhoidal skin tags: Secondary | ICD-10-CM | POA: Diagnosis not present

## 2024-08-03 ENCOUNTER — Ambulatory Visit
Admission: RE | Admit: 2024-08-03 | Discharge: 2024-08-03 | Disposition: A | Discharge status: Home / Self Care | Source: Ambulatory Visit | Attending: Nurse Practitioner | Admitting: Nurse Practitioner

## 2024-08-03 DIAGNOSIS — Z1231 Encounter for screening mammogram for malignant neoplasm of breast: Secondary | ICD-10-CM

## 2024-08-09 DIAGNOSIS — M7130 Other bursal cyst, unspecified site: Secondary | ICD-10-CM | POA: Diagnosis not present

## 2024-08-09 DIAGNOSIS — Z85820 Personal history of malignant melanoma of skin: Secondary | ICD-10-CM | POA: Diagnosis not present

## 2024-08-09 DIAGNOSIS — L719 Rosacea, unspecified: Secondary | ICD-10-CM | POA: Diagnosis not present

## 2024-08-09 DIAGNOSIS — L814 Other melanin hyperpigmentation: Secondary | ICD-10-CM | POA: Diagnosis not present

## 2024-08-09 DIAGNOSIS — L821 Other seborrheic keratosis: Secondary | ICD-10-CM | POA: Diagnosis not present

## 2024-08-09 DIAGNOSIS — Z86018 Personal history of other benign neoplasm: Secondary | ICD-10-CM | POA: Diagnosis not present

## 2024-08-09 DIAGNOSIS — L578 Other skin changes due to chronic exposure to nonionizing radiation: Secondary | ICD-10-CM | POA: Diagnosis not present

## 2024-08-09 DIAGNOSIS — D225 Melanocytic nevi of trunk: Secondary | ICD-10-CM | POA: Diagnosis not present

## 2024-08-09 DIAGNOSIS — L820 Inflamed seborrheic keratosis: Secondary | ICD-10-CM | POA: Diagnosis not present

## 2024-08-09 DIAGNOSIS — D2272 Melanocytic nevi of left lower limb, including hip: Secondary | ICD-10-CM | POA: Diagnosis not present

## 2024-08-12 DIAGNOSIS — R151 Fecal smearing: Secondary | ICD-10-CM | POA: Diagnosis not present

## 2024-08-12 DIAGNOSIS — N958 Other specified menopausal and perimenopausal disorders: Secondary | ICD-10-CM | POA: Diagnosis not present

## 2024-08-12 DIAGNOSIS — Z09 Encounter for follow-up examination after completed treatment for conditions other than malignant neoplasm: Secondary | ICD-10-CM | POA: Diagnosis not present

## 2024-09-07 DIAGNOSIS — K643 Fourth degree hemorrhoids: Secondary | ICD-10-CM | POA: Diagnosis not present

## 2024-09-17 ENCOUNTER — Other Ambulatory Visit: Payer: Self-pay | Admitting: Medical Genetics

## 2024-09-17 DIAGNOSIS — Z006 Encounter for examination for normal comparison and control in clinical research program: Secondary | ICD-10-CM

## 2024-10-15 LAB — GENECONNECT MOLECULAR SCREEN: Genetic Analysis Overall Interpretation: NEGATIVE

## 2024-10-22 ENCOUNTER — Other Ambulatory Visit

## 2024-10-22 ENCOUNTER — Ambulatory Visit: Admitting: Physician Assistant

## 2024-10-22 ENCOUNTER — Encounter: Payer: Self-pay | Admitting: Physician Assistant

## 2024-10-22 VITALS — BP 140/80 | HR 65 | Ht 64.0 in | Wt 134.0 lb

## 2024-10-22 DIAGNOSIS — R10A2 Flank pain, left side: Secondary | ICD-10-CM | POA: Diagnosis not present

## 2024-10-22 DIAGNOSIS — R1012 Left upper quadrant pain: Secondary | ICD-10-CM

## 2024-10-22 LAB — COMPREHENSIVE METABOLIC PANEL WITH GFR
ALT: 17 U/L (ref 0–35)
AST: 18 U/L (ref 0–37)
Albumin: 4.6 g/dL (ref 3.5–5.2)
Alkaline Phosphatase: 56 U/L (ref 39–117)
BUN: 20 mg/dL (ref 6–23)
CO2: 31 meq/L (ref 19–32)
Calcium: 9.6 mg/dL (ref 8.4–10.5)
Chloride: 100 meq/L (ref 96–112)
Creatinine, Ser: 0.59 mg/dL (ref 0.40–1.20)
GFR: 87.1 mL/min (ref >60.00)
Glucose, Bld: 113 mg/dL — ABNORMAL HIGH (ref 70–99)
Potassium: 4.4 meq/L (ref 3.5–5.1)
Sodium: 138 meq/L (ref 135–145)
Total Bilirubin: 0.3 mg/dL (ref 0.2–1.2)
Total Protein: 7.1 g/dL (ref 6.0–8.3)

## 2024-10-22 LAB — LIPASE: Lipase: 25 U/L (ref 11.0–59.0)

## 2024-10-22 LAB — CBC WITH DIFFERENTIAL/PLATELET
Basophils Absolute: 0 K/uL (ref 0.0–0.1)
Basophils Relative: 0.7 % (ref 0.0–3.0)
Eosinophils Absolute: 0.2 K/uL (ref 0.0–0.7)
Eosinophils Relative: 3.8 % (ref 0.0–5.0)
HCT: 39.2 % (ref 36.0–46.0)
Hemoglobin: 13 g/dL (ref 12.0–15.0)
Lymphocytes Relative: 34.6 % (ref 12.0–46.0)
Lymphs Abs: 2 K/uL (ref 0.7–4.0)
MCHC: 33.1 g/dL (ref 30.0–36.0)
MCV: 91 fl (ref 78.0–100.0)
Monocytes Absolute: 0.4 K/uL (ref 0.1–1.0)
Monocytes Relative: 6.7 % (ref 3.0–12.0)
Neutro Abs: 3.2 K/uL (ref 1.4–7.7)
Neutrophils Relative %: 54.2 % (ref 43.0–77.0)
Platelets: 286 K/uL (ref 150.0–400.0)
RBC: 4.3 Mil/uL (ref 3.87–5.11)
RDW: 14 % (ref 11.5–15.5)
WBC: 5.8 K/uL (ref 4.0–10.5)

## 2024-10-22 LAB — URINALYSIS, ROUTINE W REFLEX MICROSCOPIC
Bilirubin Urine: NEGATIVE
Ketones, ur: NEGATIVE
Leukocytes,Ua: NEGATIVE
Nitrite: NEGATIVE
Specific Gravity, Urine: 1.01 (ref 1.000–1.030)
Total Protein, Urine: NEGATIVE
Urine Glucose: NEGATIVE
Urobilinogen, UA: 0.2 (ref 0.0–1.0)
pH: 6 (ref 5.0–8.0)

## 2024-10-22 NOTE — Patient Instructions (Signed)
 Your provider has requested that you go to the basement level for lab work before leaving today. Press B on the elevator. The lab is located at the first door on the left as you exit the elevator.  You have been scheduled for an abdominal ultrasound at [facility] (1st floor of hospital) on 11/02/24 at 9:30 am. Please arrive 30 minutes prior to your appointment for registration. Make certain not to have anything to eat or drink after midnight prior to your appointment. Should you need to reschedule your appointment, please contact radiology at [phone]. This test typically takes about 30 minutes to perform.  Please follow up sooner if symptoms increase or worsen  Due to recent changes in healthcare laws, you may see the results of your imaging and laboratory studies on MyChart before your provider has had a chance to review them.  We understand that in some cases there may be results that are confusing or concerning to you. Not all laboratory results come back in the same time frame and the provider may be waiting for multiple results in order to interpret others.  Please give us  48 hours in order for your provider to thoroughly review all the results before contacting the office for clarification of your results.   Thank you for trusting me with your gastrointestinal care!   Ellouise Console, PA-C _______________________________________________________  If your blood pressure at your visit was 140/90 or greater, please contact your primary care physician to follow up on this.  _______________________________________________________  If you are age 56 or older, your body mass index should be between 23-30. Your Body mass index is 23 kg/m. If this is out of the aforementioned range listed, please consider follow up with your Primary Care Provider.  If you are age 73 or younger, your body mass index should be between 19-25. Your Body mass index is 23 kg/m. If this is out of the  aformentioned range listed, please consider follow up with your Primary Care Provider.   ________________________________________________________  The [facility] providers would like to encourage you to use MYCHART to communicate with providers for non-urgent requests or questions.  Due to long hold times on the telephone, sending your provider a message by [facility] may be a faster and more efficient way to get a response.  Please allow 48 business hours for a response.  Please remember that this is for non-urgent requests.  _______________________________________________________

## 2024-10-22 NOTE — Progress Notes (Signed)
 Ellouise Console, PA-C [address] [place], [place]  [address] Phone: [phone]   Primary Care Physician: Aisha Harvey, MD  Primary Gastroenterologist:  Ellouise Console, PA-C / Dr. Gordy Starch   Chief Complaint: Left side abdominal pain       HPI:   Discussed the use of AI scribe software for clinical note transcription with the patient, who gave verbal consent to proceed.  History of Present Illness Catherine Holland is a 78 year old female who presents with intermittent Left Sided abdominal pain.  No recent abdominal imaging or labs  Abdominal pain - Intermittent pain for the past several months - Primarily located in the left upper abdomen, radiating around the left side to the back. - Pain described as shooting and can last for several hours - Initially woke her up at night, but no longer does - Pain occurs upon waking and sometimes later in the day, tends to subside after moving around - No specific exacerbating or alleviating factors - Changing positions does not immediately relieve pain  Gastrointestinal symptoms - No diarrhea, constipation, blood in stool, or unusual weight loss - Benefiber taken at night, which helps with bowel movements  Recent gastrointestinal evaluation - Colonoscopy performed in August 2024 with no abnormalities found - No recent CT or ultrasound imaging for current symptoms  Relevant medical history - History of heart aneurysm, monitored every other year with MRI due to past melanoma diagnosis - Avoids excessive exposure to CT scans.  Musculoskeletal and congenital findings - Sacrosspinous ligament fixation surgery performed on right side on July 10, 2023 for bladder prolapse. 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 Luis.  Transferred care to  Dr. Starch.  09/2023 last colonoscopy by Dr. Starch: 3 polyps (2 mm to 5 mm) removed.  Pathology showed 1 sessile serrated and 2 colon mucosal polyps.  Pandiverticulosis.  Medium internal hemorrhoids.  Good prep.  No repeat due to advanced age.  Current Outpatient Medications  Medication Sig Dispense Refill   Ascorbic Acid (VITAMIN C PO) Take 1 tablet by mouth daily at 6 (six) AM.     B Complex Vitamins (B COMPLEX 1 PO) Take 1 tablet by mouth daily.     Cholecalciferol (VITAMIN D ) 50 MCG (2000 UT) CAPS Take 1 capsule by mouth daily at 6 (six) AM. Take 2 tabs once daily.     estradiol  (ESTRACE ) 0.01 % CREA vaginal cream Place 1 Applicatorful vaginally at bedtime.     losartan  (COZAAR ) 25 MG tablet Take 1 tablet (25 mg total) by mouth daily. 90 tablet 3   meloxicam  (MOBIC ) 15 MG tablet One tab PO every 24 hours with a meal for 2 weeks, then once every 24 hours prn pain. (Patient not taking: Reported on 10/22/2024) 30 tablet 3   tretinoin (RETIN-A) 0.025 % cream Apply 1 application topically as needed (Acne). (Patient not taking: Reported on 10/22/2024)     No current facility-administered medications for this visit.    Allergies as of 10/22/2024 - Review Complete 10/22/2024  Allergen Reaction Noted   Letrozole  09/04/2023   Meperidine hcl Itching 11/27/2020   Meperidine hcl Itching 11/27/2020   Other  11/27/2020   Tape Other (See Comments) and Rash 03/25/2017    Past Medical History:  Diagnosis Date  ABNORMAL EKG 11/09/2009   Qualifier: Diagnosis of  By: Cherrie, MD, CODY Toribio SAUNDERS    Arthritis    bilateral hands   Breast cancer (HCC) 05/08/2019   RIGHT   Cataract 2022   bilateral sx   Chest pain, unspecified    --normal myoview in july 2010    Chronic pain 01/26/2021   Constipation 01/26/2021   Diverticular disease of colon 01/26/2021   Dyslipidemia 09/22/2018   Encounter for general adult medical examination without abnormal findings 01/26/2021   Essential hypertension,  benign 03/25/2011   Ganglion cyst of left foot 07/13/2019   Generalized anxiety disorder 01/26/2021   GERD (gastroesophageal reflux disease)    hx of   Hematochezia 01/26/2021   Mixed hyperlipidemia 01/26/2021   Osteopenia of left thigh 01/26/2021   Palpitations 11/09/2009   Qualifier: Diagnosis of  By: Buell, RN, Heather     Personal history of malignant melanoma of skin 01/26/2021   Personal history of malignant neoplasm of breast 01/26/2021   Personal history of radiation therapy 2007   Seasonal allergies    Tachycardia    -monitor 12/10. Sr with PACs --echo 12/10. EF normal. no signifcant valvular abnormalities. --ETT normal 4/11     TACHYCARDIA 11/07/2009   Qualifier: Diagnosis of  By: Vivian, RMA, Sherri     Thoracic aortic aneurysm    Thoracic aortic aneurysm (TAA) 01/08/2010   Qualifier: Diagnosis of  By: Buell, RN, Heather     Vitamin D  deficiency 01/26/2021    Past Surgical History:  Procedure Laterality Date   BREAST BIOPSY Right 2007   BREAST LUMPECTOMY Right 2007   CATARACT EXTRACTION, BILATERAL Bilateral 2022   Cyst removal from L mid toe     INGUINAL HERNIA REPAIR Left 1985   MASS EXCISION Right 03/25/2017   Procedure: EXCISION MUCOID CYST WITH DISTAL PHANGEAL JOINT ARTHROTOMY RIGHT INDEX FINGER;  Surgeon: Murrell Kuba, MD;  Location: [facility];  Service: Orthopedics;  Laterality: Right;  REG/FAB   Right heel     SACROSPINOUS LIGAMENT FIXATION  07/09/2024   TUBAL LIGATION  1985    Review of Systems:    All systems reviewed and negative except where noted in HPI.    Physical Exam:  BP (!) 140/80   Pulse 65   Ht 5' 4 (1.626 m)   Wt 134 lb (60.8 kg)   BMI 23.00 kg/m  No LMP recorded. Patient is postmenopausal.  General: Well-nourished, well-developed in no acute distress.  Lungs: Clear to auscultation bilaterally. Non-labored. Heart: Regular rate and rhythm, no murmurs rubs or gallops.  Abdomen: Bowel sounds are normal; Abdomen is  Soft; No hepatosplenomegaly, masses or hernias;  No Abdominal Tenderness; No guarding or rebound tenderness. Neuro: Alert and oriented x 3.  Grossly intact.  Psych: Alert and cooperative, normal mood and affect.   Imaging Studies: No results found.  Labs: CBC    Component Value Date/Time   WBC 6.8 07/03/2021 1644   RBC 4.35 07/03/2021 1644   HGB 13.5 07/03/2021 1644   HGB 12.8 07/04/2011 1358   HCT 41.5 07/03/2021 1644   HCT 37.8 07/04/2011 1358   PLT 292 07/03/2021 1644   PLT 278 07/04/2011 1358   MCV 95.4 07/03/2021 1644   MCV 90.3 07/04/2011 1358   MCH 31.0 07/03/2021 1644   MCHC 32.5 07/03/2021 1644   RDW 13.2 07/03/2021 1644   RDW 13.6 07/04/2011 1358   LYMPHSABS 2.3 07/03/2021 1644   LYMPHSABS 1.1 07/04/2011 1358   MONOABS 0.4  07/03/2021 1644   MONOABS 0.4 07/04/2011 1358   EOSABS 0.2 07/03/2021 1644   EOSABS 0.1 07/04/2011 1358   BASOSABS 0.1 07/03/2021 1644   BASOSABS 0.0 07/04/2011 1358    CMP     Component Value Date/Time   NA 138 07/03/2021 1644   NA 142 09/22/2018 1517   K 4.3 07/03/2021 1644   CL 102 07/03/2021 1644   CO2 28 07/03/2021 1644   GLUCOSE 94 07/03/2021 1644   BUN 22 07/03/2021 1644   BUN 16 09/22/2018 1517   CREATININE 0.60 07/03/2021 1644   CALCIUM 9.1 07/03/2021 1644   PROT 7.1 07/04/2011 1358   ALBUMIN 4.3 07/04/2011 1358   AST 16 07/04/2011 1358   ALT 16 07/04/2011 1358   ALKPHOS 66 07/04/2011 1358   BILITOT 0.4 07/04/2011 1358   GFRNONAA >60 07/03/2021 1644   GFRAA 102 09/22/2018 1517       Assessment and Plan:   Catherine Holland is a 78 y.o. y/o female presents to evaluate  LUQ Pain intermittent x months 2.   Left Flank Pain intermittent x months.  Plan: - CBC, CMP, Lipase - UA - Complete abd US  - Patient declined CT  Assessment & Plan Abdominal pain of unclear etiology Intermittent left upper abdominal pain radiating to back. Differential includes musculoskeletal pain, pancreatitis, kidney pathology,  nephrolithiasis.  - Ordered Complete abdominal ultrasound for pancreas, liver, spleen, kidneys. - Ordered blood work and urine test for kidney function and screen for hematuria. - Advised pain diary for episodes, duration, associated activities or symptoms.  Suspected musculoskeletal pain Possible musculoskeletal component related to scoliosis or leg length discrepancy. Mild scoliosis on exam. - Monitor pain response to positional changes for musculoskeletal involvement.  Ellouise Console, PA-C  Follow up If symptoms worsen or persist.  Also f/u based on test results.

## 2024-10-24 ENCOUNTER — Ambulatory Visit: Payer: Self-pay | Admitting: Physician Assistant

## 2024-10-25 DIAGNOSIS — B354 Tinea corporis: Secondary | ICD-10-CM | POA: Diagnosis not present

## 2024-10-25 DIAGNOSIS — G44039 Episodic paroxysmal hemicrania, not intractable: Secondary | ICD-10-CM | POA: Diagnosis not present

## 2024-10-25 DIAGNOSIS — Z6823 Body mass index (BMI) 23.0-23.9, adult: Secondary | ICD-10-CM | POA: Diagnosis not present

## 2024-10-25 DIAGNOSIS — L301 Dyshidrosis [pompholyx]: Secondary | ICD-10-CM | POA: Diagnosis not present

## 2024-10-26 ENCOUNTER — Ambulatory Visit: Admitting: Physician Assistant

## 2024-10-27 DIAGNOSIS — L309 Dermatitis, unspecified: Secondary | ICD-10-CM | POA: Diagnosis not present

## 2024-10-27 DIAGNOSIS — D1801 Hemangioma of skin and subcutaneous tissue: Secondary | ICD-10-CM | POA: Diagnosis not present

## 2024-10-27 DIAGNOSIS — Z85820 Personal history of malignant melanoma of skin: Secondary | ICD-10-CM | POA: Diagnosis not present

## 2024-10-27 DIAGNOSIS — D229 Melanocytic nevi, unspecified: Secondary | ICD-10-CM | POA: Diagnosis not present

## 2024-10-27 DIAGNOSIS — R21 Rash and other nonspecific skin eruption: Secondary | ICD-10-CM | POA: Diagnosis not present

## 2024-10-27 DIAGNOSIS — L821 Other seborrheic keratosis: Secondary | ICD-10-CM | POA: Diagnosis not present

## 2024-11-01 ENCOUNTER — Ambulatory Visit (HOSPITAL_COMMUNITY)
Admission: RE | Admit: 2024-11-01 | Discharge: 2024-11-01 | Disposition: A | Source: Ambulatory Visit | Attending: Physician Assistant | Admitting: Physician Assistant

## 2024-11-01 DIAGNOSIS — R932 Abnormal findings on diagnostic imaging of liver and biliary tract: Secondary | ICD-10-CM | POA: Diagnosis not present

## 2024-11-01 DIAGNOSIS — R1012 Left upper quadrant pain: Secondary | ICD-10-CM | POA: Diagnosis not present

## 2024-11-01 DIAGNOSIS — R10A2 Flank pain, left side: Secondary | ICD-10-CM | POA: Diagnosis not present

## 2024-11-02 ENCOUNTER — Ambulatory Visit (HOSPITAL_COMMUNITY): Admission: RE | Admit: 2024-11-02 | Source: Ambulatory Visit

## 2024-11-09 NOTE — Progress Notes (Signed)
 I sent patient results through MyChart. Please put in recall message to reach out to patient and schedule RUQ ultrasound in 1 year.  Follow-up gallbladder polyp and liver. Ellouise Console, PA-C

## 2024-11-09 NOTE — Telephone Encounter (Signed)
 Referral placed for pt to be seen by [facility] for possible gallbladder surgery.

## 2024-11-25 ENCOUNTER — Ambulatory Visit
Admission: RE | Admit: 2024-11-25 | Discharge: 2024-11-25 | Disposition: A | Discharge status: Home / Self Care | Source: Ambulatory Visit | Attending: Family Medicine | Admitting: Family Medicine

## 2024-11-25 ENCOUNTER — Other Ambulatory Visit: Payer: Self-pay | Admitting: Family Medicine

## 2024-11-25 DIAGNOSIS — K824 Cholesterolosis of gallbladder: Secondary | ICD-10-CM

## 2024-11-25 DIAGNOSIS — R3129 Other microscopic hematuria: Secondary | ICD-10-CM

## 2024-11-25 MED ORDER — IOPAMIDOL (ISOVUE-300) INJECTION 61%
100.0000 mL | Freq: Once | INTRAVENOUS | Status: AC | PRN
  Administered 2024-11-25: 100 mL via INTRAVENOUS
